# Patient Record
Sex: Male | Born: 1940 | Race: White | Hispanic: No | Marital: Married | Smoking: Never smoker
Health system: Southern US, Community
[De-identification: ages and names within clinical notes are randomized; demographics above are authoritative.]

## PROBLEM LIST (undated history)

## (undated) DIAGNOSIS — I714 Abdominal aortic aneurysm, without rupture, unspecified: Secondary | ICD-10-CM

## (undated) DIAGNOSIS — M109 Gout, unspecified: Secondary | ICD-10-CM

## (undated) DIAGNOSIS — Z9109 Other allergy status, other than to drugs and biological substances: Secondary | ICD-10-CM

## (undated) DIAGNOSIS — E785 Hyperlipidemia, unspecified: Secondary | ICD-10-CM

## (undated) HISTORY — DX: Abdominal aortic aneurysm, without rupture: I71.4

## (undated) HISTORY — DX: Abdominal aortic aneurysm, without rupture, unspecified: I71.40

## (undated) HISTORY — PX: NO PAST SURGERIES: SHX2092

## (undated) HISTORY — PX: TONSILLECTOMY: SUR1361

---

## 2012-07-06 ENCOUNTER — Inpatient Hospital Stay (HOSPITAL_COMMUNITY)
Admission: EM | Admit: 2012-07-06 | Discharge: 2012-07-10 | DRG: 690 | Disposition: A | Discharge status: Home / Self Care | Payer: Medicare Other | Attending: Internal Medicine | Admitting: Internal Medicine

## 2012-07-06 ENCOUNTER — Encounter (HOSPITAL_COMMUNITY): Payer: Self-pay | Admitting: Emergency Medicine

## 2012-07-06 ENCOUNTER — Emergency Department (HOSPITAL_COMMUNITY): Payer: Medicare Other

## 2012-07-06 DIAGNOSIS — M79671 Pain in right foot: Secondary | ICD-10-CM

## 2012-07-06 DIAGNOSIS — E860 Dehydration: Secondary | ICD-10-CM | POA: Diagnosis present

## 2012-07-06 DIAGNOSIS — N390 Urinary tract infection, site not specified: Principal | ICD-10-CM | POA: Diagnosis present

## 2012-07-06 DIAGNOSIS — M79672 Pain in left foot: Secondary | ICD-10-CM | POA: Diagnosis present

## 2012-07-06 DIAGNOSIS — R509 Fever, unspecified: Secondary | ICD-10-CM

## 2012-07-06 DIAGNOSIS — E876 Hypokalemia: Secondary | ICD-10-CM | POA: Diagnosis present

## 2012-07-06 DIAGNOSIS — Z79899 Other long term (current) drug therapy: Secondary | ICD-10-CM

## 2012-07-06 DIAGNOSIS — E871 Hypo-osmolality and hyponatremia: Secondary | ICD-10-CM | POA: Diagnosis present

## 2012-07-06 DIAGNOSIS — E785 Hyperlipidemia, unspecified: Secondary | ICD-10-CM | POA: Diagnosis present

## 2012-07-06 DIAGNOSIS — M109 Gout, unspecified: Secondary | ICD-10-CM | POA: Diagnosis present

## 2012-07-06 DIAGNOSIS — D638 Anemia in other chronic diseases classified elsewhere: Secondary | ICD-10-CM | POA: Diagnosis present

## 2012-07-06 DIAGNOSIS — M79673 Pain in unspecified foot: Secondary | ICD-10-CM

## 2012-07-06 DIAGNOSIS — D649 Anemia, unspecified: Secondary | ICD-10-CM | POA: Diagnosis present

## 2012-07-06 DIAGNOSIS — M79609 Pain in unspecified limb: Secondary | ICD-10-CM | POA: Diagnosis present

## 2012-07-06 DIAGNOSIS — R269 Unspecified abnormalities of gait and mobility: Secondary | ICD-10-CM | POA: Diagnosis present

## 2012-07-06 DIAGNOSIS — J111 Influenza due to unidentified influenza virus with other respiratory manifestations: Secondary | ICD-10-CM | POA: Diagnosis present

## 2012-07-06 HISTORY — DX: Hyperlipidemia, unspecified: E78.5

## 2012-07-06 LAB — CBC WITH DIFFERENTIAL/PLATELET
Basophils Absolute: 0.1 10*3/uL (ref 0.0–0.1)
Eosinophils Relative: 0 % (ref 0–5)
HCT: 40 % (ref 39.0–52.0)
Lymphs Abs: 1 10*3/uL (ref 0.7–4.0)
MCH: 32.4 pg (ref 26.0–34.0)
MCV: 92.6 fL (ref 78.0–100.0)
Monocytes Absolute: 2.5 10*3/uL — ABNORMAL HIGH (ref 0.1–1.0)
Neutro Abs: 6.8 10*3/uL (ref 1.7–7.7)
Platelets: 297 10*3/uL (ref 150–400)
RDW: 12.7 % (ref 11.5–15.5)

## 2012-07-06 LAB — COMPREHENSIVE METABOLIC PANEL
AST: 37 U/L (ref 0–37)
BUN: 19 mg/dL (ref 6–23)
CO2: 25 mEq/L (ref 19–32)
Calcium: 9.3 mg/dL (ref 8.4–10.5)
Creatinine, Ser: 1.14 mg/dL (ref 0.50–1.35)
GFR calc non Af Amer: 63 mL/min — ABNORMAL LOW (ref >90)
Total Bilirubin: 0.9 mg/dL (ref 0.3–1.2)

## 2012-07-06 LAB — URINALYSIS, ROUTINE W REFLEX MICROSCOPIC
Nitrite: POSITIVE — AB
Protein, ur: 100 mg/dL — AB
Specific Gravity, Urine: 1.035 — ABNORMAL HIGH (ref 1.005–1.030)
Urobilinogen, UA: 4 mg/dL — ABNORMAL HIGH (ref 0.0–1.0)

## 2012-07-06 LAB — URINE MICROSCOPIC-ADD ON

## 2012-07-06 LAB — LACTIC ACID, PLASMA: Lactic Acid, Venous: 2.9 mmol/L — ABNORMAL HIGH (ref 0.5–2.2)

## 2012-07-06 LAB — SEDIMENTATION RATE: Sed Rate: 72 mm/hr — ABNORMAL HIGH (ref 0–16)

## 2012-07-06 MED ORDER — SODIUM CHLORIDE 0.9 % IV SOLN
1000.0000 mL | INTRAVENOUS | Status: DC
  Administered 2012-07-06: 1000 mL via INTRAVENOUS

## 2012-07-06 MED ORDER — SODIUM CHLORIDE 0.9 % IV SOLN
1000.0000 mL | Freq: Once | INTRAVENOUS | Status: AC
  Administered 2012-07-06: 1000 mL via INTRAVENOUS

## 2012-07-06 MED ORDER — ACETAMINOPHEN 325 MG PO TABS
650.0000 mg | ORAL_TABLET | ORAL | Status: DC | PRN
  Administered 2012-07-06: 650 mg via ORAL
  Filled 2012-07-06 (×2): qty 2

## 2012-07-06 NOTE — ED Notes (Signed)
Pt states that he has had flu like sx for the past week (cough, sneeze, body aches, fever, chills).  Pt states that his worst problem is the pain in both of his feet.  States pain is so debilitating that he cannot walk.  Pt is normally able to get around just fine by himself.  Pain 10/10.  Temp 100.7 in triage.

## 2012-07-06 NOTE — ED Notes (Signed)
Patient transported to X-ray 

## 2012-07-06 NOTE — ED Notes (Signed)
Report given to floor RN

## 2012-07-06 NOTE — ED Provider Notes (Signed)
History     CSN: [id number]  Arrival date & time 07/06/12  1611   First MD Initiated Contact with Patient 07/06/12 1816      Chief Complaint  Patient presents with  . Flu Like Sx   . Foot Pain    (Consider location/radiation/quality/duration/timing/severity/associated sxs/prior treatment) HPI Comments: Syeed Rathgeber is a 71 y.o. Male who has been ill for one week with a cough, fever, and chills. Today, he is unable to walk, secondary to worsening, bilateral foot pain. There's been no trauma. He reports the cough is nonproductive and is improving. He has had decreased appetite for several days. He is taking an over-the-counter cough and cold medicine with partial relief. There are no other modifying factors. His wife is sick with similar illness. He has had gout in the past, but does not take medicine for it routinely  Patient is a 71 y.o. male presenting with lower extremity pain. The history is provided by the patient.  Foot Pain    History reviewed. No pertinent past medical history.  History reviewed. No pertinent past surgical history.  No family history on file.  History  Substance Use Topics  . Smoking status: Never Smoker   . Smokeless tobacco: Not on file  . Alcohol Use: No      Review of Systems  All other systems reviewed and are negative.    Allergies  Review of patient's allergies indicates no known allergies.  Home Medications   Current Outpatient Rx  Name  Route  Sig  Dispense  Refill  . ACETAMINOPHEN 500 MG PO TABS   Oral   Take 1,000 mg by mouth every 6 (six) hours as needed. Pain         . ATORVASTATIN CALCIUM 40 MG PO TABS   Oral   Take 20 mg by mouth daily.         Marland Kitchen DIPHENHYDRAMINE HCL 25 MG PO TABS   Oral   Take 25 mg by mouth every 6 (six) hours as needed.         Marland Kitchen DIVALPROEX SODIUM ER 250 MG PO TB24   Oral   Take 500 mg by mouth daily.         . GUAIFENESIN 100 MG/5ML PO LIQD   Oral   Take 200 mg by mouth 3 (three)  times daily as needed.         Marland Kitchen PSEUDOEPHEDRINE-ACETAMINOPHEN 30-500 MG PO TABS   Oral   Take 1 tablet by mouth every 4 (four) hours as needed. congestion           BP 140/78  Pulse 102  Temp 101.5 F (38.6 C) (Oral)  Resp 19  SpO2 98%  Physical Exam  Nursing note and vitals reviewed. Constitutional: He is oriented to person, place, and time. He appears well-developed and well-nourished.  HENT:  Head: Normocephalic and atraumatic.  Right Ear: External ear normal.  Left Ear: External ear normal.  Eyes: Conjunctivae normal and EOM are normal. Pupils are equal, round, and reactive to light.  Neck: Normal range of motion and phonation normal. Neck supple.  Cardiovascular: Normal rate, regular rhythm, normal heart sounds and intact distal pulses.        Capillary refill is intact distally in the feet  Pulmonary/Chest: Effort normal and breath sounds normal. He exhibits no bony tenderness.  Abdominal: Soft. Normal appearance. There is no tenderness.  Musculoskeletal: Normal range of motion.       Bilateral diffuse foot pain, and  ankle pain, right greater than left. There is indistinct erythematous areas on both feet. There is no foot swelling. There are no other large joint, tenderness or swelling areas.  Neurological: He is alert and oriented to person, place, and time. He has normal strength. No cranial nerve deficit or sensory deficit. He exhibits normal muscle tone. Coordination normal.  Skin: Skin is warm, dry and intact.  Psychiatric: He has a normal mood and affect. His behavior is normal. Judgment and thought content normal.    ED Course  Procedures (including critical care time)       Labs Reviewed  CBC WITH DIFFERENTIAL - Abnormal; Notable for the following:    Lymphocytes Relative 10 (*)     Monocytes Relative 24 (*)     Monocytes Absolute 2.5 (*)     All other components within normal limits  COMPREHENSIVE METABOLIC PANEL - Abnormal; Notable for the  following:    Sodium 132 (*)     Chloride 93 (*)     Glucose, Bld 136 (*)     Albumin 3.4 (*)     Alkaline Phosphatase 138 (*)     GFR calc non Af Amer 63 (*)     GFR calc Af Amer 73 (*)     All other components within normal limits  URINALYSIS, ROUTINE W REFLEX MICROSCOPIC - Abnormal; Notable for the following:    Color, Urine ORANGE (*)  BIOCHEMICALS MAY BE AFFECTED BY COLOR   APPearance CLOUDY (*)     Specific Gravity, Urine 1.035 (*)     Hgb urine dipstick MODERATE (*)     Bilirubin Urine MODERATE (*)     Ketones, ur TRACE (*)     Protein, ur 100 (*)     Urobilinogen, UA 4.0 (*)     Nitrite POSITIVE (*)     Leukocytes, UA SMALL (*)     All other components within normal limits  LACTIC ACID, PLASMA - Abnormal; Notable for the following:    Lactic Acid, Venous 2.9 (*)     All other components within normal limits  SEDIMENTATION RATE - Abnormal; Notable for the following:    Sed Rate 72 (*)     All other components within normal limits  URINE MICROSCOPIC-ADD ON - Abnormal; Notable for the following:    Bacteria, UA FEW (*)     All other components within normal limits  URIC ACID  CULTURE, BLOOD (ROUTINE X 2)  CULTURE, BLOOD (ROUTINE X 2)  URINE CULTURE  INFLUENZA PANEL BY PCR   Dg Chest 2 View  07/06/2012  *RADIOLOGY REPORT*  Clinical Data: Congestion and shortness of breath.  CHEST - 2 VIEW  Comparison: None  Findings: The cardiac silhouette, mediastinal and hilar contours are normal.  The lungs are clear.  No pleural effusion.  The bony thorax is intact.  IMPRESSION: No acute cardiopulmonary findings.   Original Report Authenticated By: Rudie Meyer, M.D.      1. Fever   2. Foot pain       MDM  Febrile illness with foot pain. Mild, lactate elevation, but normal blood pressure. He was SIRS Positive. Doubt severe sepsis. Most likely unifying diagnosis is post infectious disease, arthritis. Influenza is a possibility. No evidence for pneumonia. Urinalysis is abnormal,  however, not clearly indicative of UTI. Cultures pending. Patient is unable ambulate, therefore, will be admitted for stabilization and further evaluation.    Plan: Admit- [facility]        Flint Melter,  MD 07/08/12 1610

## 2012-07-06 NOTE — ED Notes (Signed)
Attempted to call report to floor RN. RN was busy. Will call back.

## 2012-07-06 NOTE — ED Notes (Signed)
Bedside report received from previous RN 

## 2012-07-06 NOTE — ED Notes (Signed)
Lab called. Will send swab for Flu PCR.

## 2012-07-07 ENCOUNTER — Encounter (HOSPITAL_COMMUNITY): Payer: Self-pay | Admitting: *Deleted

## 2012-07-07 DIAGNOSIS — E860 Dehydration: Secondary | ICD-10-CM

## 2012-07-07 DIAGNOSIS — E785 Hyperlipidemia, unspecified: Secondary | ICD-10-CM

## 2012-07-07 DIAGNOSIS — R6889 Other general symptoms and signs: Secondary | ICD-10-CM

## 2012-07-07 DIAGNOSIS — M79609 Pain in unspecified limb: Secondary | ICD-10-CM

## 2012-07-07 DIAGNOSIS — J111 Influenza due to unidentified influenza virus with other respiratory manifestations: Secondary | ICD-10-CM

## 2012-07-07 LAB — BASIC METABOLIC PANEL
BUN: 15 mg/dL (ref 6–23)
Calcium: 7.8 mg/dL — ABNORMAL LOW (ref 8.4–10.5)
GFR calc non Af Amer: 82 mL/min — ABNORMAL LOW (ref >90)
Glucose, Bld: 108 mg/dL — ABNORMAL HIGH (ref 70–99)
Sodium: 136 mEq/L (ref 135–145)

## 2012-07-07 LAB — CBC
MCH: 32.3 pg (ref 26.0–34.0)
MCHC: 35.1 g/dL (ref 30.0–36.0)
Platelets: 248 10*3/uL (ref 150–400)
RBC: 3.47 MIL/uL — ABNORMAL LOW (ref 4.22–5.81)

## 2012-07-07 LAB — INFLUENZA PANEL BY PCR (TYPE A & B): H1N1 flu by pcr: NOT DETECTED

## 2012-07-07 MED ORDER — ENOXAPARIN SODIUM 40 MG/0.4ML ~~LOC~~ SOLN
40.0000 mg | SUBCUTANEOUS | Status: DC
  Administered 2012-07-07 – 2012-07-10 (×4): 40 mg via SUBCUTANEOUS
  Filled 2012-07-07 (×5): qty 0.4

## 2012-07-07 MED ORDER — MORPHINE SULFATE 2 MG/ML IJ SOLN
1.0000 mg | INTRAMUSCULAR | Status: DC | PRN
  Administered 2012-07-07 – 2012-07-09 (×6): 1 mg via INTRAVENOUS
  Filled 2012-07-07 (×6): qty 1

## 2012-07-07 MED ORDER — CEFTRIAXONE SODIUM 1 G IJ SOLR
1.0000 g | INTRAMUSCULAR | Status: DC
  Administered 2012-07-07 – 2012-07-09 (×3): 1 g via INTRAVENOUS
  Filled 2012-07-07 (×3): qty 10

## 2012-07-07 MED ORDER — IBUPROFEN 400 MG PO TABS
400.0000 mg | ORAL_TABLET | Freq: Three times a day (TID) | ORAL | Status: AC
  Administered 2012-07-07 – 2012-07-08 (×6): 400 mg via ORAL
  Filled 2012-07-07 (×7): qty 1

## 2012-07-07 MED ORDER — ACETAMINOPHEN 650 MG RE SUPP: 650.0000 mg | Freq: Four times a day (QID) | RECTAL | Status: DC | PRN

## 2012-07-07 MED ORDER — GUAIFENESIN-DM 100-10 MG/5ML PO SYRP
5.0000 mL | ORAL_SOLUTION | ORAL | Status: DC | PRN
  Administered 2012-07-07 – 2012-07-08 (×3): 5 mL via ORAL
  Filled 2012-07-07 (×3): qty 10

## 2012-07-07 MED ORDER — ONDANSETRON HCL 4 MG/2ML IJ SOLN: 4.0000 mg | Freq: Four times a day (QID) | INTRAMUSCULAR | Status: DC | PRN

## 2012-07-07 MED ORDER — DIVALPROEX SODIUM ER 500 MG PO TB24
500.0000 mg | ORAL_TABLET | Freq: Every day | ORAL | Status: DC
Start: 2012-07-07 — End: 2012-07-10
  Administered 2012-07-07 – 2012-07-10 (×4): 500 mg via ORAL
  Filled 2012-07-07 (×5): qty 1

## 2012-07-07 MED ORDER — ONDANSETRON HCL 4 MG PO TABS: 4.0000 mg | ORAL_TABLET | Freq: Four times a day (QID) | ORAL | Status: DC | PRN

## 2012-07-07 MED ORDER — SODIUM CHLORIDE 0.9 % IV SOLN
1000.0000 mL | INTRAVENOUS | Status: DC
  Administered 2012-07-07 – 2012-07-08 (×2): 1000 mL via INTRAVENOUS

## 2012-07-07 MED ORDER — ATORVASTATIN CALCIUM 20 MG PO TABS
20.0000 mg | ORAL_TABLET | Freq: Every day | ORAL | Status: DC
  Administered 2012-07-07 – 2012-07-09 (×3): 20 mg via ORAL
  Filled 2012-07-07 (×4): qty 1

## 2012-07-07 MED ORDER — ALBUTEROL SULFATE (5 MG/ML) 0.5% IN NEBU: 2.5000 mg | INHALATION_SOLUTION | RESPIRATORY_TRACT | Status: DC | PRN

## 2012-07-07 MED ORDER — POTASSIUM CHLORIDE 10 MEQ/100ML IV SOLN
10.0000 meq | INTRAVENOUS | Status: AC
  Administered 2012-07-07 (×3): 10 meq via INTRAVENOUS
  Filled 2012-07-07 (×3): qty 100

## 2012-07-07 MED ORDER — OXYCODONE HCL 5 MG PO TABS
5.0000 mg | ORAL_TABLET | ORAL | Status: DC | PRN
  Filled 2012-07-07: qty 1

## 2012-07-07 NOTE — Progress Notes (Addendum)
[facility] PROGRESS NOTE  Gino Garrabrant XBJ:[id number] DOB: 10-27-1940 DOA: 07/06/2012 PCP: No primary provider on file.  Brief narrative: 71 -year-old male with history of HL and gout presents to the emergency department with one-week history of flulike symptoms. Evaluation in ED revealed urinary tract infection. Influenza panel is negative. We will D/C droplet precaution.  Assessment/Plan:  Principal Problem:  *Urinary tract infection - Will start Rocephin IV daily - Follow up results of urine culture  Active Problems:  Dehydration - Secondary to urinary tract infection - Continue IV fluids - Encourage oral intake  Hypokalemia -  Repleted today - Followup BMP in the morning  Anemia - Hemoglobin dropped from 14 to 11.2; this may be due to lab error - Follow hemoglobin in the morning  Dyslipidemia - Continue atorvastatin 20 mg daily  Code Status: full code Family Communication: no family at bedside Disposition Plan: home when stable; needs PT evaluation  Manson Passey, MD  [facility] Pager [phone]  If 7PM-7AM, please contact night-coverage [email or web address] Password [email or web address] 07/07/2012, 3:07 PM   LOS: 1 day   Consultants:  None   Procedures:  None   Antibiotics:  Rocephin 07/07/2012 -->  HPI/Subjective: Feels weak.  Objective: Filed Vitals:   07/07/12 0018 07/07/12 0020 07/07/12 0600 07/07/12 1300  BP:  132/72 125/89 104/52  Pulse:  91 84 85  Temp:  99.1 F (37.3 C) 98.9 F (37.2 C) 97.9 F (36.6 C)  TempSrc:  Oral Oral   Resp:  20 20 16   Height: 5\' 6"  (1.676 m)     Weight: 69.854 kg (154 lb)     SpO2:  96% 98% 95%    Intake/Output Summary (Last 24 hours) at 07/07/12 1507 Last data filed at 07/07/12 1300  Gross per 24 hour  Intake   1080 ml  Output   1053 ml  Net     27 ml    Exam:   General:  Pt is alert, follows commands appropriately, not in acute distress  Cardiovascular: Regular rate and rhythm, S1/S2, no murmurs, no rubs, no  gallops  Respiratory: Clear to auscultation bilaterally, no wheezing, no crackles, no rhonchi  Abdomen: Soft, mildly tender across mid abdomen, non distended, bowel sounds present, no guarding  Extremities: LE pitting pedal (+1-2) edema, pulses DP and PT palpable bilaterally  Neuro: Grossly nonfocal  Data Reviewed: Basic Metabolic Panel:  Lab 07/07/12 4696 07/06/12 1846  NA 136 132*  K 3.2* 3.6  CL 104 93*  CO2 22 25  GLUCOSE 108* 136*  BUN 15 19  CREATININE 0.97 1.14  CALCIUM 7.8* 9.3   Liver Function Tests:  Lab 07/06/12 1846  AST 37  ALT 41  ALKPHOS 138*  BILITOT 0.9  PROT 8.2  ALBUMIN 3.4*   CBC:  Lab 07/07/12 0357 07/06/12 1846  WBC 9.7 10.4  HGB 11.2* 14.0  HCT 31.9* 40.0  MCV 91.9 92.6  PLT 248 297    Studies: Dg Chest 2 View 07/06/2012  *IMPRESSION: No acute cardiopulmonary findings.    Scheduled Meds:  . atorvastatin  20 mg Oral Daily  . cefTRIAXone   1 g Intravenous Q24H  . divalproex  500 mg Oral Daily  . enoxaparin (LOVENOX)   40 mg Subcutaneous Q24H  . ibuprofen  400 mg Oral TID

## 2012-07-07 NOTE — Evaluation (Signed)
Physical Therapy Evaluation Patient Details Name: Jose Wright MRN: [id number] DOB: 1941-06-10 Today's Date: 07/07/2012 Time: 1308-6578 PT Time Calculation (min): 25 min  PT Assessment / Plan / Recommendation Clinical Impression  71 yo male with flulike symptoms for >7 days admitted with pain in both feet R>L with inalbility to stand or walk.  Today, he was found to have edema in feet and ankles with pain and decreased ROM.  He was unable to stand depsite good effort. Hopefully, pt will progress with acute care and be able to d/c to home with HHPT and assist of wife.  If not, pt may need short term SNF    PT Assessment  Patient needs continued PT services    Follow Up Recommendations  Home health PT;Post acute inpatient;Other (comment) (depends on pt progress)    Does the patient have the potential to tolerate intense rehabilitation      Barriers to Discharge        Equipment Recommendations  Rolling walker with 5" wheels;3 in 1 bedside comode    Recommendations for Other Services OT consult   Frequency Min 3X/week    Precautions / Restrictions Precautions Precautions: Fall Restrictions Weight Bearing Restrictions: No   Pertinent Vitals/Pain Pt c/o acute pain in feet especially with touch and ROM      Mobility  Bed Mobility Bed Mobility: Rolling Right;Rolling Left Rolling Right: 6: Modified independent (Device/Increase time);With rail Rolling Left: 6: Modified independent (Device/Increase time);With rail Details for Bed Mobility Assistance: extra tme for rolling to left Transfers Transfers: Sit to Stand;Stand to Sit Sit to Stand: Other (comment) (unable to lift hips off bed despite multiple attempts) Details for Transfer Assistance: Pt tried multiple times with arms on walker/bedside rail, but he was uanble to bear wieight on feet enough to stand.  Pt reports he had too much pain and LE weakness also Ambulation/Gait Ambulation/Gait Assistance: Not tested (comment)      Shoulder Instructions     Exercises General Exercises - Lower Extremity Ankle Circles/Pumps: AROM;Right;Left;10 reps;Supine;Seated (also encouraged toe ROM for flex/ext/add/abd) Quad Sets: AROM;Both;10 reps;Supine (unable to fully extend right knee) Gluteal Sets: AROM;Both;10 reps;Supine Short Arc Quad: AROM;Both;10 reps;Supine Hip ABduction/ADduction: AROM;Both;10 reps;Sidelying Straight Leg Raises: AROM;Both;10 reps;Supine Hip Flexion/Marching: AROM;Both;10 reps;Supine Toe Raises: AROM;Both;5 reps;Seated Heel Raises: AROM;Both;Seated Other Exercises Other Exercises: bridges x 10 in supine Other Exercises: deep breathing, abd sets   PT Diagnosis: Difficulty walking;Generalized weakness;Acute pain  PT Problem List: Decreased strength;Pain;Decreased mobility;Decreased activity tolerance;Decreased knowledge of use of DME PT Treatment Interventions: DME instruction;Gait training;Functional mobility training;Therapeutic activities;Therapeutic exercise;Cognitive remediation;Patient/family education   PT Goals Acute Rehab PT Goals PT Goal Formulation: With patient Time For Goal Achievement: 07/21/12 Potential to Achieve Goals: Good Pt will go Supine/Side to Sit: Independently PT Goal: Supine/Side to Sit - Progress: Goal set today Pt will go Sit to Supine/Side: Independently PT Goal: Sit to Supine/Side - Progress: Goal set today Pt will go Sit to Stand: with modified independence PT Goal: Sit to Stand - Progress: Goal set today Pt will go Stand to Sit: with modified independence PT Goal: Stand to Sit - Progress: Goal set today Pt will Ambulate: >150 feet;with supervision;with least restrictive assistive device PT Goal: Ambulate - Progress: Goal set today Pt will Go Up / Down Stairs: 3-5 stairs;with min assist;with least restrictive assistive device PT Goal: Up/Down Stairs - Progress: Goal set today  Visit Information  Last PT Received On: 07/07/12 Assistance Needed: +2     Subjective Data  Subjective: "I've never  had anything like this before" Patient Stated Goal: To get better   Prior Functioning  Home Living Lives With: Spouse Available Help at Discharge: Family Type of Home: House Home Access: Stairs to enter [employer] of Steps: a couple Entrance Stairs-Rails: Right;Left Home Layout: Able to live on main level with bedroom/bathroom Prior Function Level of Independence: Independent Able to Take Stairs?: Yes Driving: Yes Communication Communication: No difficulties    Cognition  Overall Cognitive Status: Appears within functional limits for tasks assessed/performed Arousal/Alertness: Awake/alert Orientation Level: Appears intact for tasks assessed Behavior During Session: [facility] for tasks performed    Extremity/Trunk Assessment Right Lower Extremity Assessment RLE ROM/Strength/Tone: Deficits RLE ROM/Strength/Tone Deficits: pt with pitting edema in foot and ankle, warm to touch.  Pt holds knee and ankle in semi flexion with decreased ability to straighten knee all the way out or dorsiflex ankle to neutral  RLE Sensation: Deficits RLE Sensation Deficits: cannot assess as pt has too much pain to touch Left Lower Extremity Assessment LLE ROM/Strength/Tone: Deficits LLE ROM/Strength/Tone Deficits: less swelling and pain in left leg, but pt still has some.  He is better able to move hip, knee and ankle, but still lacks some dorsiflexion to neutral LLE Sensation: Deficits LLE Sensation Deficits: pain to touch Trunk Assessment Trunk Assessment: Normal   Balance Balance Balance Assessed: Yes Static Sitting Balance Static Sitting - Balance Support: No upper extremity supported Static Sitting - Level of Assistance: 7: Independent Static Sitting - Comment/# of Minutes: ~5  End of Session PT - End of Session Activity Tolerance: Patient limited by pain Patient left: in bed Nurse Communication: Mobility status  GP     Rosey Bath K. Manson Passey,  PT 07/07/2012, 3:18 PM

## 2012-07-07 NOTE — Progress Notes (Signed)
ANTIBIOTIC CONSULT NOTE - INITIAL  Pharmacy Consult for Rocephin Indication: UTI pending cx results  No Known Allergies  Labs:  [facility] 07/07/12 0357 07/06/12 1846  WBC 9.7 10.4  HGB 11.2* 14.0  PLT 248 297  [facility] -- --  CREATININE 0.97 1.14   Estimated Creatinine Clearance: 63.9 ml/min (by C-G formula based on Cr of 0.97). No results found for this basename: VANCOTROUGH:2,VANCOPEAK:2,VANCORANDOM:2,GENTTROUGH:2,GENTPEAK:2,GENTRANDOM:2,TOBRATROUGH:2,TOBRAPEAK:2,TOBRARND:2,AMIKACINPEAK:2,AMIKACINTROU:2,AMIKACIN:2, in the last 72 hours   Microbiology: No results found for this or any previous visit (from the past 720 hour(s)).  Medical History: Past Medical History  Diagnosis Date  . Hyperlipemia     Medications:  Scheduled:    . [COMPLETED] sodium chloride  1,000 mL Intravenous Once   Followed by  . [COMPLETED] sodium chloride  1,000 mL Intravenous Once  . atorvastatin  20 mg Oral Daily  . divalproex  500 mg Oral Daily  . enoxaparin (LOVENOX) injection  40 mg Subcutaneous Q24H  . ibuprofen  400 mg Oral TID  . potassium chloride  10 mEq Intravenous Q1 Hr x 3   Assessment: 71 yo male admitted with flu-like symptoms found to have a positive UA and now starting Rocephin pending cx results  Goal of Therapy:  eradication of infection  Plan:  1) Rocephin 1g IV q24 2) Follow up Cx results 3) Will sign off. Thank you for the consult   Hessie Knows, PharmD, BCPS Pager [phone] 07/07/2012 10:46 AM

## 2012-07-07 NOTE — H&P (Signed)
[facility] History and Physical  [name] AVW:[id number] DOB: 1940/12/08 DOA: 07/06/2012  Referring physician: Dr. Mancel Bale PCP: No primary provider on file. sees PCP in the [place]. Specialists: None  Chief Complaint: Flulike symptoms and feet pain.  HPI: Jose Wright is a 71 y.o. male with history of HL and gout presents to the emergency department with one-week history of "flulike symptoms". He and his wife developed symptoms at the same time. He gives history of cough which is mostly been dry and occasional green sputum, fevers as high as 101F, sneezing, headache, generalized body aches and pains, poor appetite, generalized weakness. He has been using over-the-counter cough medications along with Tylenol. He has not taken flu shot this year yet. For the last 4 days he has noticed pain in both feet without any associated swelling.? Redness of the left foot. Gives remote history of gout. Has not been able to stand up or walk secondary to feet pain. Due to persistent symptoms, patient presented to the emergency department. In the ED, patient had MAXIMUM TEMPERATURE of 101.62F, chest x-ray negative, UA not convincing for UTI and patient does not have any dysuria or urinary frequency and ESR 72. Patient denies sore throat, neck pain, nausea, vomiting, abdominal pain, diarrhea, open wounds. The hospitalist service is requested to admit for further evaluation and management. Blood cultures, urine culture and influenza panel have been sent and pending.   Review of Systems: All systems reviewed and apart from history of presenting illness, are negative.  Past Medical History  Diagnosis Date  . Hyperlipemia    Past Surgical History  Procedure Date  . No past surgeries    Social History:  reports that he has never smoked. He does not have any smokeless tobacco history on file. He reports that he does not drink alcohol or use illicit drugs. Patient is married. He is a  retired Engineer, production. Lives with his wife and is independent of activities of daily living.  No Known Allergies  Family History  Problem Relation Age of Onset  . Asthma Father   . Asthma Son     Prior to Admission medications   Medication Sig Start Date End Date Taking? Authorizing Provider  acetaminophen (TYLENOL) 500 MG tablet Take 1,000 mg by mouth every 6 (six) hours as needed. Pain   Yes Historical Provider, MD  atorvastatin (LIPITOR) 40 MG tablet Take 20 mg by mouth daily.   Yes Historical Provider, MD  diphenhydrAMINE (BENADRYL) 25 MG tablet Take 25 mg by mouth every 6 (six) hours as needed.   Yes Historical Provider, MD  divalproex (DEPAKOTE ER) 250 MG 24 hr tablet Take 500 mg by mouth daily.   Yes Historical Provider, MD  guaiFENesin (ROBITUSSIN) 100 MG/5ML liquid Take 200 mg by mouth 3 (three) times daily as needed.   Yes Historical Provider, MD  pseudoephedrine-acetaminophen (TYLENOL SINUS) 30-500 MG TABS Take 1 tablet by mouth every 4 (four) hours as needed. congestion   Yes Historical Provider, MD   Physical Exam: Filed Vitals:   07/06/12 2100 07/06/12 2116 07/06/12 2230 07/06/12 2258  BP: 140/78 140/78 144/81   Pulse: 95 102    Temp:  100.2 F (37.9 C)  101.5 F (38.6 C)  TempSrc:  Oral  Oral  Resp: 24 19 25    SpO2: 97% 98% 95%     General exam: Moderately built and nourished male patient, ill looking but not toxic, lying comfortably on the gurney and is in no obvious distress  Head, eyes and ENT: Nontraumatic and normocephalic. Pupils equally reacting to light and accommodation. Oral mucosa is dry. No acute findings.  Neck: Supple. No JVD, carotid bruit or thyromegaly.  Lymphatics: No lymphadenopathy.  Respiratory system: Clear to auscultation. No increased work of breathing.  Cardiovascular system: S1 and S2 heard, regular rate and rhythm. No JVD, murmurs or gallops. No pedal edema.  Gastrointestinal system: Abdomen is nondistended, soft and nontender. No  organomegaly or masses appreciated. Normal bowel sounds heard.  Central nervous system: Alert and oriented. No focal neurological deficits.  Extremities: Symmetric 5 x 5 power. Peripheral pulses symmetrically felt. Patient is tender to touch on the dorsum and plantar aspects of both feet right greater than the left but no other acute findings such as significant redness, joint swelling. No increased warmth in any particular area. Skin: Without any acute findings.  Musculoskeletal system: Exam is negative.  Psychiatric: Pleasant and cooperative.  Labs on Admission:  Basic Metabolic Panel:  Lab 07/06/12 3086  NA 132*  K 3.6  CL 93*  CO2 25  GLUCOSE 136*  BUN 19  CREATININE 1.14  CALCIUM 9.3  MG --  PHOS --   Liver Function Tests:  Lab 07/06/12 1846  AST 37  ALT 41  ALKPHOS 138*  BILITOT 0.9  PROT 8.2  ALBUMIN 3.4*   No results found for this basename: LIPASE:5,AMYLASE:5 in the last 168 hours No results found for this basename: AMMONIA:5 in the last 168 hours CBC:  Lab 07/06/12 1846  WBC 10.4  NEUTROABS 6.8  HGB 14.0  HCT 40.0  MCV 92.6  PLT 297   Cardiac Enzymes: No results found for this basename: CKTOTAL:5,CKMB:5,CKMBINDEX:5,TROPONINI:5 in the last 168 hours  BNP (last 3 results) No results found for this basename: PROBNP:3 in the last 8760 hours CBG: No results found for this basename: GLUCAP:5 in the last 168 hours  Radiological Exams on Admission: Dg Chest 2 View  07/06/2012  *RADIOLOGY REPORT*  Clinical Data: Congestion and shortness of breath.  CHEST - 2 VIEW  Comparison: None  Findings: The cardiac silhouette, mediastinal and hilar contours are normal.  The lungs are clear.  No pleural effusion.  The bony thorax is intact.  IMPRESSION: No acute cardiopulmonary findings.   Original Report Authenticated By: Rudie Meyer, M.D.     Assessment/Plan Principal Problem:  *Influenza-like illness Active Problems:  Dehydration  Pain in both feet   Hyperlipidemia   1. Influenza-like illness: Admit to medical floor for observation and evaluation. Followup on blood and urine cultures and influenza panel. Place on droplet isolation until influenza panel is tested and negative. Symptomatic treatment. 2. Hyponatremic Dehydration: Patient has received 2 L of normal saline in the ED. Continue with normal saline hydration. 3. Pain in both feet: Unclear etiology.? Secondary to problem #1. Does not seem like gout flare. No acute findings on physical exam. Treat with NSAIDs for 2 days and monitor. If symptoms are not better in the next 12-24 hours, consider further evaluation. Serum uric acid level is normal. 4. Hyperlipidemia: Continue home medications.    Code Status: Full Family Communication: Discussed directly with patient Disposition Plan: Home in medically stable.  Time spent: 50 minutes  [facility] Pager [phone]  If 7PM-7AM, please contact night-coverage [email or web address] Password [email or web address] 07/07/2012, 12:10 AM

## 2012-07-08 ENCOUNTER — Inpatient Hospital Stay (HOSPITAL_COMMUNITY): Payer: Medicare Other

## 2012-07-08 DIAGNOSIS — M79609 Pain in unspecified limb: Secondary | ICD-10-CM

## 2012-07-08 DIAGNOSIS — N390 Urinary tract infection, site not specified: Principal | ICD-10-CM

## 2012-07-08 DIAGNOSIS — R509 Fever, unspecified: Secondary | ICD-10-CM

## 2012-07-08 LAB — URINE CULTURE

## 2012-07-08 LAB — CBC
HCT: 32.6 % — ABNORMAL LOW (ref 39.0–52.0)
Hemoglobin: 10.9 g/dL — ABNORMAL LOW (ref 13.0–17.0)
MCV: 93.9 fL (ref 78.0–100.0)
RBC: 3.47 MIL/uL — ABNORMAL LOW (ref 4.22–5.81)
RDW: 13.4 % (ref 11.5–15.5)
WBC: 10.8 10*3/uL — ABNORMAL HIGH (ref 4.0–10.5)

## 2012-07-08 LAB — BASIC METABOLIC PANEL
BUN: 17 mg/dL (ref 6–23)
CO2: 22 mEq/L (ref 19–32)
Chloride: 105 mEq/L (ref 96–112)
Creatinine, Ser: 0.95 mg/dL (ref 0.50–1.35)
GFR calc Af Amer: >90 mL/min (ref >90)
Glucose, Bld: 87 mg/dL (ref 70–99)
Potassium: 3.3 mEq/L — ABNORMAL LOW (ref 3.5–5.1)

## 2012-07-08 MED ORDER — POTASSIUM CHLORIDE CRYS ER 20 MEQ PO TBCR
40.0000 meq | EXTENDED_RELEASE_TABLET | Freq: Once | ORAL | Status: AC
  Administered 2012-07-08: 40 meq via ORAL
  Filled 2012-07-08: qty 2

## 2012-07-08 MED ORDER — PREDNISONE 20 MG PO TABS
20.0000 mg | ORAL_TABLET | Freq: Every day | ORAL | Status: DC
  Administered 2012-07-08 – 2012-07-10 (×3): 20 mg via ORAL
  Filled 2012-07-08 (×5): qty 1

## 2012-07-08 MED ORDER — VANCOMYCIN HCL 1000 MG IV SOLR
750.0000 mg | Freq: Two times a day (BID) | INTRAVENOUS | Status: DC
  Administered 2012-07-08 – 2012-07-10 (×5): 750 mg via INTRAVENOUS
  Filled 2012-07-08 (×6): qty 750

## 2012-07-08 MED ORDER — COLCHICINE 0.6 MG PO TABS
0.6000 mg | ORAL_TABLET | Freq: Every day | ORAL | Status: DC
  Administered 2012-07-08 – 2012-07-09 (×2): 0.6 mg via ORAL
  Filled 2012-07-08 (×3): qty 1

## 2012-07-08 MED ORDER — POTASSIUM CHLORIDE 10 MEQ/100ML IV SOLN
10.0000 meq | INTRAVENOUS | Status: AC
  Administered 2012-07-08 (×3): 10 meq via INTRAVENOUS
  Filled 2012-07-08 (×3): qty 100

## 2012-07-08 NOTE — Progress Notes (Signed)
ANTIBIOTIC CONSULT NOTE - INITIAL  Pharmacy Consult for Vancomycin Indication: Cellulitis of LLE  No Known Allergies  Patient Measurements: Height: 5\' 6"  (167.6 cm) Weight: 154 lb (69.854 kg) IBW/kg (Calculated) : 63.8   Vital Signs: Temp: 99.2 F (37.3 C) (11/05 0500) Temp src: Oral (11/05 0500) BP: 127/56 mmHg (11/05 0500) Pulse Rate: 87  (11/05 0500) Intake/Output from previous day: 11/04 0701 - 11/05 0700 In: 3880 [P.O.:1680; I.V.:2200] Out: 653 [Urine:650; Stool:3] Intake/Output from this shift:    Labs:  Basename 07/08/12 0539 07/07/12 0357 07/06/12 1846  WBC 10.8* 9.7 10.4  HGB 10.9* 11.2* 14.0  PLT 241 248 297  LABCREA -- -- --  CREATININE 0.95 0.97 1.14   Estimated Creatinine Clearance: 65.3 ml/min (by C-G formula based on Cr of 0.95). No results found for this basename: VANCOTROUGH:2,VANCOPEAK:2,VANCORANDOM:2,GENTTROUGH:2,GENTPEAK:2,GENTRANDOM:2,TOBRATROUGH:2,TOBRAPEAK:2,TOBRARND:2,AMIKACINPEAK:2,AMIKACINTROU:2,AMIKACIN:2, in the last 72 hours   Microbiology: Recent Results (from the past 720 hour(s))  CULTURE, BLOOD (ROUTINE X 2)     Status: Normal (Preliminary result)   Collection Time   07/06/12  6:46 PM      Component Value Range Status Comment   Specimen Description BLOOD RIGHT ANTECUBITAL   Final    Special Requests BOTTLES DRAWN AEROBIC AND ANAEROBIC 5CC   Final    Culture  Setup Time 07/07/2012 09:45   Final    Culture     Final    Value:        BLOOD CULTURE RECEIVED NO GROWTH TO DATE CULTURE WILL BE HELD FOR 5 DAYS BEFORE ISSUING A FINAL NEGATIVE REPORT   Report Status PENDING   Incomplete   CULTURE, BLOOD (ROUTINE X 2)     Status: Normal (Preliminary result)   Collection Time   07/06/12  7:07 PM      Component Value Range Status Comment   Specimen Description BLOOD LEFT ARM   Final    Special Requests BOTTLES DRAWN AEROBIC AND ANAEROBIC [facility] EACH   Final    Culture  Setup Time 07/07/2012 09:45   Final    Culture     Final    Value:         BLOOD CULTURE RECEIVED NO GROWTH TO DATE CULTURE WILL BE HELD FOR 5 DAYS BEFORE ISSUING A FINAL NEGATIVE REPORT   Report Status PENDING   Incomplete   URINE CULTURE     Status: Normal   Collection Time   07/06/12  7:13 PM      Component Value Range Status Comment   Specimen Description URINE, CLEAN CATCH   Final    Special Requests NONE   Final    Culture  Setup Time 07/07/2012 10:17   Final    Colony Count 20,OOO COLONIES/ML   Final    Culture     Final    Value: Multiple bacterial morphotypes present, none predominant. Suggest appropriate recollection if clinically indicated.   Report Status 07/08/2012 FINAL   Final     Medical History: Past Medical History  Diagnosis Date  . Hyperlipemia     Medications:  Anti-infectives     Start     Dose/Rate Route Frequency Ordered Stop   07/08/12 1100   vancomycin (VANCOCIN) 750 mg in sodium chloride 0.9 % 150 mL IVPB        750 mg 150 mL/hr over 60 Minutes Intravenous 2 times daily 07/08/12 1038     07/07/12 1200   cefTRIAXone (ROCEPHIN) 1 g in dextrose 5 % 50 mL IVPB  1 g 100 mL/hr over 30 Minutes Intravenous Every 24 hours 07/07/12 1047           Assessment: 70 yoM with 1 wk hx of flu-like symptoms and 4 day hx of foot pain, no swelling noted, RLE questionable redness. Hx of gout; doubt cause of foot pain. CXray clear, negative for influenza, UA not convincing for UTI per H&P.  Rocephin begun 11/4, Tmax 101.5 on admit, continues with low grade temp.  Vancomycin ordered 11/5 for possible cellulitis RLE  Clearance wnl, no allergies noted.  Goal of Therapy:  Vancomycin trough level 10-15 mcg/ml  Plan:  Vancomycin 750mg  q12 Monitor blood, urine cultures, renal function Trough at steady state if necessary.  Otho Bellows PharmD Pager [phone] 07/08/2012,10:32 AM

## 2012-07-08 NOTE — Progress Notes (Signed)
*  PRELIMINARY RESULTS* Vascular Ultrasound Lower extremity venous duplex has been completed.  Preliminary findings: Bilateral:  No evidence of DVT, superficial thrombosis, or Baker's Cyst.    Farrel Demark, RDMS, RVT 07/08/2012, 2:41 PM

## 2012-07-08 NOTE — Progress Notes (Signed)
[facility] PROGRESS NOTE  Jose Wright ZOX:[id number] DOB: Dec 28, 1940 DOA: 07/06/2012 PCP: No primary provider on file.  Brief narrative: 71 -year-old male with history of HL and gout presents to the emergency department with one-week history of flulike symptoms. Evaluation in ED revealed urinary tract infection for which he is receiving ceftriaxone.. Influenza panel is negative. Hospital course is complicated due to persistent bilateral foot pain and swelling although right more than left along with the inability to bear weight on either leg.  X rays of both feet were done and there was no joint effusion or fractures/disslocations identified. Venous doppler done bilaterally and findings are negative for DVT. The pain perhaps may be due to gout flare up so we have started colchicine and prednisone along with pain medications.   Assessment/Plan:   Principal Problem:  *Urinary tract infection   We have started rocephin IV daily  Urine culture result showed too many morphologies so inaccurate specimen for analysis (since patient already received antibiotics we will not order another urine culture as it will likely be negative)  Active Problems:  Bilateral foot pain  Seems most likely gout flare up although unusual since both feel are involved; less likely septic joint(s)  There is slightly more edema and redness over right foot then left so the differential includes possible cellulitis  I have started vancomycin for possible cellulitis  Colchicine and prednisone started  Pain regimen is with oxycodone 5 mg PO Q 4 hours PRN moderate pain and morphine 1 mg IV Q 4 hours PRN severe pain  If patient does not improve ortho consult may be needed  Need PT evaluation Dehydration   Secondary to urinary tract infection or gout flare  Patient has received IV fluids  At this time patient tolerates PO intake so we will d/c IV fluids Hypokalemia   Repleted today   Followup BMP in  the morning  Anemia   Hemoglobin dropped from 14 to 11.2 and this morning to 10.9  No signs of active bleed  We will continue to follow CBC Dyslipidemia   Continue atorvastatin 20 mg daily   Code Status: full code  Family Communication: no family at bedside  Disposition Plan: home when stable; needs PT evaluation   Manson Passey, MD  [facility]  Pager [phone]   If 7PM-7AM, please contact night-coverage [email or web address] Password [email or web address] 07/08/2012, 2:54 PM   LOS: 2 days   HPI/Subjective: Still complains of feeling weak and having pain in his feet along with the inability to bear weight.  Objective: Filed Vitals:   07/07/12 0600 07/07/12 1300 07/07/12 2042 07/08/12 0500  BP: 125/89 104/52 99/54 127/56  Pulse: 84 85 74 87  Temp: 98.9 F (37.2 C) 97.9 F (36.6 C) 99 F (37.2 C) 99.2 F (37.3 C)  TempSrc: Oral  Tympanic Oral  Resp: 20 16 18 20   Height:      Weight:      SpO2: 98% 95% 99% 100%    Intake/Output Summary (Last 24 hours) at 07/08/12 1454 Last data filed at 07/08/12 1150  Gross per 24 hour  Intake   2800 ml  Output    675 ml  Net   2125 ml    Exam:   General:  Pt is alert, follows commands appropriately, not in acute distress  Cardiovascular: Regular rate and rhythm, S1/S2, no murmurs, no rubs, no gallops  Respiratory: Clear to auscultation bilaterally, no wheezing, no crackles, no rhonchi  Abdomen: Soft, non tender, non distended, bowel sounds present,  no guarding  Extremities: pedal edema right > left as well as new erythema and tenderness to palpation, pulses DP and PT palpable bilaterally  Neuro: Grossly nonfocal  Data Reviewed: Basic Metabolic Panel:  Lab 07/08/12 1914 07/07/12 0357 07/06/12 1846  NA 137 136 132*  K 3.3* 3.2* 3.6  CL 105 104 93*  CO2 22 22 25   GLUCOSE 87 108* 136*  BUN 17 15 19   CREATININE 0.95 0.97 1.14  CALCIUM 7.9* 7.8* 9.3   Liver Function Tests:  Lab 07/06/12 1846  AST 37  ALT 41  ALKPHOS 138*  BILITOT 0.9    PROT 8.2  ALBUMIN 3.4*   CBC:  Lab 07/08/12 0539 07/07/12 0357 07/06/12 1846  WBC 10.8* 9.7 10.4  HGB 10.9* 11.2* 14.0  HCT 32.6* 31.9* 40.0  MCV 93.9 91.9 92.6  PLT 241 248 297    Recent Results (from the past 240 hour(s))  CULTURE, BLOOD (ROUTINE X 2)     Status: Normal (Preliminary result)   Collection Time   07/06/12  6:46 PM      Component Value Range Status Comment   Specimen Description BLOOD RIGHT ANTECUBITAL   Final    Special Requests BOTTLES DRAWN AEROBIC AND ANAEROBIC 5CC   Final    Culture  Setup Time 07/07/2012 09:45   Final    Culture     Final    Value:        BLOOD CULTURE RECEIVED NO GROWTH TO DATE CULTURE WILL BE HELD FOR 5 DAYS BEFORE ISSUING A FINAL NEGATIVE REPORT   Report Status PENDING   Incomplete   CULTURE, BLOOD (ROUTINE X 2)     Status: Normal (Preliminary result)   Collection Time   07/06/12  7:07 PM      Component Value Range Status Comment   Specimen Description BLOOD LEFT ARM   Final    Special Requests BOTTLES DRAWN AEROBIC AND ANAEROBIC [facility] EACH   Final    Culture  Setup Time 07/07/2012 09:45   Final    Culture     Final    Value:        BLOOD CULTURE RECEIVED NO GROWTH TO DATE CULTURE WILL BE HELD FOR 5 DAYS BEFORE ISSUING A FINAL NEGATIVE REPORT   Report Status PENDING   Incomplete   URINE CULTURE     Status: Normal   Collection Time   07/06/12  7:13 PM      Component Value Range Status Comment   Specimen Description URINE, CLEAN CATCH   Final    Special Requests NONE   Final    Culture  Setup Time 07/07/2012 10:17   Final    Colony Count 20,OOO COLONIES/ML   Final    Culture     Final    Value: Multiple bacterial morphotypes present, none predominant. Suggest appropriate recollection if clinically indicated.   Report Status 07/08/2012 FINAL   Final      Studies: Dg Chest 2 View 07/06/2012  *RADIOLOGY REPORT*  Clinical Data: Congestion and shortness of breath.  CHEST - 2 VIEW  Comparison: None  Findings: The cardiac silhouette,  mediastinal and hilar contours are normal.  The lungs are clear.  No pleural effusion.  The bony thorax is intact.  IMPRESSION: No acute cardiopulmonary findings.   Original Report Authenticated By: Rudie Meyer, M.D.    Dg Foot Complete Left 07/08/2012  *RADIOLOGY REPORT*  Clinical Data: Pain  LEFT FOOT - COMPLETE 3+ VIEW  Comparison: None.  Findings: Frontal,  oblique, and lateral views were obtained.  There is no fracture or dislocation.  Joint spaces appear intact.  No erosive change. 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  IMPRESSION: No fracture or dislocation.  No erosive change.   Original Report Authenticated By: Bretta Bang, M.D.    Dg Foot Complete Right 07/08/2012  *RADIOLOGY REPORT*  Clinical Data: Pain  RIGHT FOOT COMPLETE - 3+ VIEW  Comparison: None.  Findings:  Frontal, oblique, and lateral views were obtained.  No fracture or dislocation.  Joint spaces appear intact.  No erosive change.  IMPRESSION: No fracture or appreciable arthropathic change.   Original Report Authenticated By: Bretta Bang, M.D.     Scheduled Meds:  . atorvastatin  20 mg Oral Daily  . cefTRIAXone   1 g Intravenous Q24H  . colchicine  0.6 mg Oral Daily  . divalproex  500 mg Oral Daily  . enoxaparin (LOVENOX)   40 mg Subcutaneous Q24H  . ibuprofen  400 mg Oral TID  . predniSONE  20 mg Oral Q breakfast  . vancomycin  750 mg Intravenous BID

## 2012-07-09 DIAGNOSIS — E876 Hypokalemia: Secondary | ICD-10-CM | POA: Diagnosis present

## 2012-07-09 DIAGNOSIS — D649 Anemia, unspecified: Secondary | ICD-10-CM | POA: Diagnosis present

## 2012-07-09 MED ORDER — PANTOPRAZOLE SODIUM 40 MG PO TBEC
40.0000 mg | DELAYED_RELEASE_TABLET | Freq: Every day | ORAL | Status: DC
  Administered 2012-07-09 – 2012-07-10 (×2): 40 mg via ORAL
  Filled 2012-07-09 (×2): qty 1

## 2012-07-09 MED ORDER — COLCHICINE 0.6 MG PO TABS
0.6000 mg | ORAL_TABLET | Freq: Two times a day (BID) | ORAL | Status: DC
  Administered 2012-07-10: 0.6 mg via ORAL
  Filled 2012-07-09 (×2): qty 1

## 2012-07-09 MED ORDER — POTASSIUM CHLORIDE IN NACL 40-0.9 MEQ/L-% IV SOLN
INTRAVENOUS | Status: DC
  Administered 2012-07-09 – 2012-07-10 (×2): via INTRAVENOUS
  Filled 2012-07-09 (×3): qty 1000

## 2012-07-09 MED ORDER — SODIUM CHLORIDE 0.9 % IV SOLN: INTRAVENOUS | Status: DC

## 2012-07-09 MED ORDER — COLCHICINE 0.6 MG PO TABS
0.6000 mg | ORAL_TABLET | ORAL | Status: AC
  Administered 2012-07-09 (×2): 0.6 mg via ORAL
  Filled 2012-07-09 (×2): qty 1

## 2012-07-09 MED ORDER — NAPROXEN 500 MG PO TABS
500.0000 mg | ORAL_TABLET | Freq: Two times a day (BID) | ORAL | Status: DC
  Administered 2012-07-09 – 2012-07-10 (×2): 500 mg via ORAL
  Filled 2012-07-09 (×4): qty 1

## 2012-07-09 NOTE — Progress Notes (Signed)
[facility] PROGRESS NOTE  Jose Wright EXB:[id number] DOB: 1941-07-27 DOA: 07/06/2012 PCP: No primary provider on file.  Goes to [place] in [place].  Brief narrative: My Pline is a 71 year old man with a PMH of hyperlipidemia and gout who was admitted with flu-like symptoms and foot pain on 07/07/12.  Assessment/Plan: Principal Problem:  *Influenza-like illness  Influenza studies negative.  Supportive care. Active Problems:  Normocytic anemia  Hemoglobin slowly trending down over time.  Likely AOCD.  Dehydration  Hydrating.  Pain in both feet secondary to gout flare  Plain films done on feet 07/08/12 negative for fracture, other pathology.  Uric acid level 6.9, ESR 72.  Spoke with Dr. Lestine Box (on call orthopedic MD) who felt that the patient's presentation is consistent with gout despite normal uric acid levels.  Recommended stepping up colchicine therapy, and offered to formally consult if no improvement in 24 hours.  Also on Naprosyn and prednisone.  Since cellulitis in differential, also on Vancomycin.  (D/C Rocephin as patient does not have a UTI).  Hyperlipidemia  Continue Lipitor.  Hypokalemia  Potassium added to IVF.  Monitor.   Code Status: Full. Family Communication: None at bedside. Disposition Plan: Home when stable.   Medical Consultants:  Telephone consultation with Dr. Lestine Box.  Other Consultants:  Physical therapy: Home health PT  Anti-infectives:  Vancomycin 07/08/12--->  Rocephin 07/07/12--->07/09/12  HPI/Subjective: Jose Wright is still having significant foot pain and swelling, to the point where he is unable to bear weight.  He has not had any appreciable response to colchicine, prednisone or vancomycin.  His uric acid levels were not elevated.  In the past, his gout has only affected his great toe.    Objective: Filed Vitals:   07/08/12 2139 07/09/12 0644 07/09/12 0830 07/09/12 1406  BP: 137/74 117/74  137/73  Pulse: 78 82  90    Temp: 97.9 F (36.6 C) 100.6 F (38.1 C) 98.1 F (36.7 C) 99.5 F (37.5 C)  TempSrc: Axillary Oral Oral Oral  Resp: 22 16  18   Height:      Weight:  73.664 kg (162 lb 6.4 oz)    SpO2: 100% 96%  99%    Intake/Output Summary (Last 24 hours) at 07/09/12 1603 Last data filed at 07/09/12 1525  Gross per 24 hour  Intake   1745 ml  Output   1820 ml  Net    -75 ml    Exam: Gen:  NAD Cardiovascular:  RRR, No M/R/G Respiratory:  Lungs CTAB Gastrointestinal:  Abdomen soft, NT/ND, + BS Extremities:  RLE > LLE swelling, erythema involving ankles.  Left great toe erythema over metatarsal head.  Data Reviewed: Basic Metabolic Panel:  Lab 07/08/12 1027 07/07/12 0357 07/06/12 1846  NA 137 136 132*  K 3.3* 3.2* --  CL 105 104 93*  CO2 22 22 25   GLUCOSE 87 108* 136*  BUN 17 15 19   CREATININE 0.95 0.97 1.14  CALCIUM 7.9* 7.8* 9.3  MG -- -- --  PHOS -- -- --   GFR Estimated Creatinine Clearance: 65.3 ml/min (by C-G formula based on Cr of 0.95). Liver Function Tests:  Lab 07/06/12 1846  AST 37  ALT 41  ALKPHOS 138*  BILITOT 0.9  PROT 8.2  ALBUMIN 3.4*   CBC:  Lab 07/08/12 0539 07/07/12 0357 07/06/12 1846  WBC 10.8* 9.7 10.4  NEUTROABS -- -- 6.8  HGB 10.9* 11.2* 14.0  HCT 32.6* 31.9* 40.0  MCV 93.9 91.9 92.6  PLT 241 248 297  Urinalysis    Component Value Date/Time   COLORURINE ORANGE* 07/06/2012 1913   APPEARANCEUR CLOUDY* 07/06/2012 1913   LABSPEC 1.035* 07/06/2012 1913   PHURINE 6.0 07/06/2012 1913   GLUCOSEU NEGATIVE 07/06/2012 1913   HGBUR MODERATE* 07/06/2012 1913   BILIRUBINUR MODERATE* 07/06/2012 1913   KETONESUR TRACE* 07/06/2012 1913   PROTEINUR 100* 07/06/2012 1913   UROBILINOGEN 4.0* 07/06/2012 1913   NITRITE POSITIVE* 07/06/2012 1913   LEUKOCYTESUR SMALL* 07/06/2012 1913   Microbiology Recent Results (from the past 240 hour(s))  CULTURE, BLOOD (ROUTINE X 2)     Status: Normal (Preliminary result)   Collection Time   07/06/12  6:46 PM      Component  Value Range Status Comment   Specimen Description BLOOD RIGHT ANTECUBITAL   Final    Special Requests BOTTLES DRAWN AEROBIC AND ANAEROBIC 5CC   Final    Culture  Setup Time 07/07/2012 09:45   Final    Culture     Final    Value:        BLOOD CULTURE RECEIVED NO GROWTH TO DATE CULTURE WILL BE HELD FOR 5 DAYS BEFORE ISSUING A FINAL NEGATIVE REPORT   Report Status PENDING   Incomplete   CULTURE, BLOOD (ROUTINE X 2)     Status: Normal (Preliminary result)   Collection Time   07/06/12  7:07 PM      Component Value Range Status Comment   Specimen Description BLOOD LEFT ARM   Final    Special Requests BOTTLES DRAWN AEROBIC AND ANAEROBIC [facility] EACH   Final    Culture  Setup Time 07/07/2012 09:45   Final    Culture     Final    Value:        BLOOD CULTURE RECEIVED NO GROWTH TO DATE CULTURE WILL BE HELD FOR 5 DAYS BEFORE ISSUING A FINAL NEGATIVE REPORT   Report Status PENDING   Incomplete   URINE CULTURE     Status: Normal   Collection Time   07/06/12  7:13 PM      Component Value Range Status Comment   Specimen Description URINE, CLEAN CATCH   Final    Special Requests NONE   Final    Culture  Setup Time 07/07/2012 10:17   Final    Colony Count 20,OOO COLONIES/ML   Final    Culture     Final    Value: Multiple bacterial morphotypes present, none predominant. Suggest appropriate recollection if clinically indicated.   Report Status 07/08/2012 FINAL   Final      Procedures and Diagnostic Studies:  Dg Chest 2 View 07/06/2012  IMPRESSION: No acute cardiopulmonary findings.   Original Report Authenticated By: Rudie Meyer, M.D.     Dg Foot Complete Left 07/08/2012 IMPRESSION: No fracture or dislocation.  No erosive change.   Original Report Authenticated By: Bretta Bang, M.D.     Dg Foot Complete Right 07/08/2012 IMPRESSION: No fracture or appreciable arthropathic change.   Original Report Authenticated By: Bretta Bang, M.D.     Scheduled Meds:    . atorvastatin  20 mg Oral Daily    . cefTRIAXone (ROCEPHIN)  IV  1 g Intravenous Q24H  . [COMPLETED] colchicine  0.6 mg Oral Q2H  . colchicine  0.6 mg Oral BID  . divalproex  500 mg Oral Daily  . enoxaparin (LOVENOX) injection  40 mg Subcutaneous Q24H  . [COMPLETED] ibuprofen  400 mg Oral TID  . naproxen  500 mg Oral BID WC  . pantoprazole  40 mg Oral Daily  . [COMPLETED] potassium chloride  10 mEq Intravenous Q1 Hr x 3  . predniSONE  20 mg Oral Q breakfast  . vancomycin  750 mg Intravenous BID  . [DISCONTINUED] colchicine  0.6 mg Oral Daily   Continuous Infusions:    . 0.9 % NaCl with KCl 40 mEq / L 75 mL/hr at 07/09/12 1213  . [DISCONTINUED] sodium chloride      Time spent: 35 minutes.   LOS: 3 days   RAMA,CHRISTINA  [facility] Pager [phone].  If 8PM-8AM, please contact night-coverage at [email or web address], password [facility] 07/09/2012, 4:03 PM

## 2012-07-09 NOTE — Progress Notes (Signed)
Physical Therapy Treatment Patient Details Name: Jose Wright MRN: [id number] DOB: December 22, 1940 Today's Date: 07/09/2012 Time: 4098-1191 PT Time Calculation (min): 35 min  PT Assessment / Plan / Recommendation Comments on Treatment Session  Pt continues to be limited by LE edema and pain.  Left great toe is reddened, but he is able to bear weight on LLE today enough to stand.  RLE is painful and swollen and ankle and knee.  Pt is not able to bear weight on RLE or even do much with ROM    Follow Up Recommendations  SNF;Supervision/Assistance - 24 hour ([facility]     Does the patient have the potential to tolerate intense rehabilitation     Barriers to Discharge        Equipment Recommendations  Rolling walker with 5" wheels;3 in 1 bedside comode    Recommendations for Other Services    Frequency Min 3X/week   Plan Discharge plan needs to be updated    Precautions / Restrictions     Pertinent Vitals/Pain Pt c/o severe pain in edematous Rknee and ankle and L ankle    Mobility  Bed Mobility Bed Mobility: Rolling Right;Rolling Left Rolling Right: 6: Modified independent (Device/Increase time);With rail Rolling Left: 6: Modified independent (Device/Increase time);With rail Details for Bed Mobility Assistance: extra tme for rolling to left Transfers Transfers: Sit to Stand;Stand to Sit;Squat Pivot Transfers Sit to Stand: 1: +2 Total assist;From elevated surface (NWB on Right leg due to pain and swelling) Sit to Stand: Patient Percentage: 50% Stand to Sit: 3: Mod assist Details for Transfer Assistance: Pt unable to even attempt to weight bear throught RLE due to pain and swelling.  He was able to bear weight througth LLE,  and stand erect with hips extended under trunk  He stood x2.  On third attempt , he performed squat pivot to chair, but had difficulty plantatflexing foot to pivot.  Recommend use of stedy for return to bed Nursing  informed Ambulation/Gait Ambulation/Gait Assistance: Not tested (comment) Assistive device: Rolling walker Stairs: No Wheelchair Mobility Wheelchair Mobility: No    Exercises General Exercises - Lower Extremity Ankle Circles/Pumps: AROM;Right;Left;10 reps;Supine;Seated (not able to do much with RLE due to apin, sweling) Quad Sets: AROM;Both;10 reps;Supine (unable to fully extend right knee more swelling anterior kne) Gluteal Sets: AROM;Both;10 reps;Supine Short Arc Quad: AROM;10 reps;Supine Long Arc Quad: Other (comment);AROM;Both;5 reps;Seated (limited on RLE) Hip ABduction/ADduction: AROM;Both;10 reps;Sidelying Straight Leg Raises: AROM;Both;10 reps;Supine Hip Flexion/Marching: AROM;Both;10 reps;Supine Other Exercises Other Exercises: bridges x 10 in supine Other Exercises: deep breathing, abd sets   PT Diagnosis:    PT Problem List:   PT Treatment Interventions:     PT Goals Acute Rehab PT Goals PT Goal Formulation: With patient Time For Goal Achievement: 07/21/12 Potential to Achieve Goals: Good Pt will go Supine/Side to Sit: Independently PT Goal: Supine/Side to Sit - Progress: Progressing toward goal Pt will go Sit to Supine/Side: Independently PT Goal: Sit to Supine/Side - Progress: Progressing toward goal Pt will go Sit to Stand: with modified independence PT Goal: Sit to Stand - Progress: Progressing toward goal Pt will go Stand to Sit: with modified independence PT Goal: Stand to Sit - Progress: Progressing toward goal Pt will Ambulate: >150 feet;with supervision;with least restrictive assistive device PT Goal: Ambulate - Progress: Not progressing (due to pain in swelling in legs) Pt will Go Up / Down Stairs: 3-5 stairs;with min assist;with least restrictive assistive device  Visit  Information  Last PT Received On: 07/09/12 Assistance Needed: +2 Reason Eval/Treat Not Completed: Pain limiting ability to participate    Subjective Data  Subjective: It up into my  knee Patient Stated Goal: to get better   Cognition  Overall Cognitive Status: Appears within functional limits for tasks assessed/performed Arousal/Alertness: Awake/alert Orientation Level: Appears intact for tasks assessed Behavior During Session: [facility] for tasks performed    Balance  Static Sitting Balance Static Sitting - Balance Support: No upper extremity supported Static Sitting - Level of Assistance: 7: Independent  End of Session PT - End of Session Activity Tolerance: Patient limited by pain Patient left: in chair;with call bell/phone within reach Nurse Communication: Need for lift equipment   GP     Rosey Bath K. [place], [place] [phone] 07/09/2012, 3:32 PM

## 2012-07-10 DIAGNOSIS — M109 Gout, unspecified: Secondary | ICD-10-CM | POA: Diagnosis present

## 2012-07-10 DIAGNOSIS — R269 Unspecified abnormalities of gait and mobility: Secondary | ICD-10-CM | POA: Diagnosis present

## 2012-07-10 MED ORDER — COLCHICINE 0.6 MG PO TABS: 0.6000 mg | ORAL_TABLET | Freq: Two times a day (BID) | ORAL | Status: AC

## 2012-07-10 MED ORDER — NAPROXEN 500 MG PO TABS: 500.0000 mg | ORAL_TABLET | Freq: Two times a day (BID) | ORAL | Status: DC | PRN

## 2012-07-10 NOTE — Progress Notes (Signed)
Physical Therapy Treatment Patient Details Name: Jose Wright MRN: [id number] DOB: 1941-02-09 Today's Date: 07/10/2012 Time: 5621-3086 PT Time Calculation (min): 39 min  PT Assessment / Plan / Recommendation Comments on Treatment Session  Pt much improved today and asking if he would be able to go home as he is feeling much better. Pt is able to to perform bed mobility independently, transfer and walk with RW with min assist.  He does need help with steps and feels that his son will be able to assist.  He will need HHPT, RW and 3 in 1 for home.    Follow Up Recommendations  Home health PT;Supervision - Intermittent     Does the patient have the potential to tolerate intense rehabilitation     Barriers to Discharge        Equipment Recommendations  Rolling walker with 5" wheels;3 in 1 bedside comode    Recommendations for Other Services    Frequency Min 3X/week   Plan Discharge plan needs to be updated    Precautions / Restrictions Restrictions Weight Bearing Restrictions: Yes   Pertinent Vitals/Pain Pt c/o pain at 6/10 in right knee when he moves it in certain angles.  Pain much improved overall today    Mobility  Bed Mobility Bed Mobility: Supine to Sit Supine to Sit: 6: Modified independent (Device/Increase time) Details for Bed Mobility Assistance: still needs extra time, but better than yesterday Transfers Transfers: Sit to Stand;Stand to Sit Sit to Stand: From elevated surface;4: Min guard (NWB on Right leg due to pain and swelling) Stand to Sit: 6: Modified independent (Device/Increase time) Details for Transfer Assistance: cues to remind to push up with arms on armrests Ambulation/Gait Ambulation/Gait Assistance: Not tested (comment);4: Min assist Ambulation Distance (Feet): 200 Feet (100, 50, 25, 25) Assistive device: Rolling walker Ambulation/Gait Assistance Details: pt needs some cues for sequence and posture in standing and gait Gait Pattern: Step-to  pattern;Decreased step length - right;Decreased step length - left;Trunk flexed Gait velocity: decreased General Gait Details: still with some pain and edema in Right ankle and knee limiting gait.  left leg is much improved overall and able to accept weight in ambulation Pt has tendence to lean forward in gait, but is improved with trunk and hip extension. 'I don't have to muscle it so much" Stairs: Yes Stairs Assistance: 3: Mod assist Stairs Assistance Details (indicate cue type and reason): pt needs cues for sequence an postural cues and pt tends to lean forward over walker.  Technique much improved with trunk extension Pt reports he will have assit of son and his friend at time of discharge.  Stair Management Technique: No rails;Backwards;Step to pattern Number of Stairs: 2  Wheelchair Mobility Wheelchair Mobility: No    Exercises General Exercises - Lower Extremity Ankle Circles/Pumps: AROM;Right;Left;10 reps;Supine;Seated Quad Sets: AROM;Both;10 reps;Supine Gluteal Sets: AROM;Both;10 reps;Supine Short Arc Quad: AROM;10 reps;Supine Long Arc Quad: Other (comment);AROM;Both;5 reps;Seated (limited on RLE) Hip ABduction/ADduction: AROM;Both;10 reps;Sidelying Straight Leg Raises: AROM;Both;10 reps;Supine Hip Flexion/Marching: AROM;Both;10 reps;Supine Other Exercises Other Exercises: bridges x 10 in supine Other Exercises: deep breathing, abd sets   PT Diagnosis:    PT Problem List:   PT Treatment Interventions:     PT Goals Acute Rehab PT Goals PT Goal: Supine/Side to Sit - Progress: Met PT Goal: Sit to Supine/Side - Progress: Met PT Goal: Sit to Stand - Progress: Met PT Goal: Stand to Sit - Progress: Met PT Goal: Ambulate - Progress: Progressing toward goal PT Goal: Up/Down Stairs -  Progress: Progressing toward goal  Visit Information  Last PT Received On: 07/10/12 Assistance Needed: +2    Subjective Data  Subjective: I'd really like to go home Patient Stated Goal: to go  home today   Cognition  Overall Cognitive Status: Appears within functional limits for tasks assessed/performed Arousal/Alertness: Awake/alert Orientation Level: Appears intact for tasks assessed Behavior During Session: [facility] for tasks performed    Balance  Balance Balance Assessed: Yes Static Sitting Balance Static Sitting - Level of Assistance: 1: +2 Total assist  End of Session PT - End of Session Equipment Utilized During Treatment: Gait belt Activity Tolerance: Patient limited by fatigue Patient left: in chair;with call bell/phone within reach Nurse Communication: Mobility status   GP    Rosey Bath K. Manson Passey, [place] [phone] 07/10/2012, 11:00 AM

## 2012-07-10 NOTE — Discharge Summary (Addendum)
Physician Discharge Summary  Jose Wright:[id number] DOB: [birth year] DOA: 07/06/2012  PCP: No primary provider on file.  Admit date: 07/06/2012 Discharge date: 07/10/2012  Recommendations for Outpatient Follow-up:  1. F/U [facility] for ongoing gout symptoms.  Discharge Diagnoses:   Principal Problem:  *Influenza-like illness, likely viral  Active Problems:   Dehydration   Pain in both feet secondary to gout flare   Gout attack   Hyperlipidemia   Hypokalemia   Normocytic anemia   Gait abnormality secondary to gout flare   Discharge Condition: Improved.  Diet recommendation: Low sodium, heart healthy; Avoid organ meats.  History of present illness:  Mr. Jose Wright is a 71 year old man with a PMH of hyperlipidemia and gout who was admitted with flu-like symptoms and foot pain on 07/07/12.  Hospital Course by problem:  Principal Problem:  *Influenza-like illness  Influenza studies negative. Fever related to viral illness which improved through hospital stay.  NO EVIDENCE of sepsis, UTI or other infectious process. Supportive care provided. Active Problems:  Normocytic anemia  Hemoglobin slowly trending down over time. Likely AOCD. Dehydration  Hydrated. Pain in both feet secondary to gout flare  Plain films done on feet 07/08/12 negative for fracture, other pathology. Uric acid level 6.9, ESR 72.  Spoke with Dr. Lestine Box (on call orthopedic MD) who felt that the patient's presentation is consistent with gout despite normal uric acid levels. Recommended stepping up colchicine therapy, and offered to formally consult if no improvement in 24 hours.  The patient had dramatic improvement with the colchicine. Also on Naprosyn and prednisone. Since cellulitis in differential, also was treated with Vancomycin. (D/C Rocephin as patient does not have a UTI).  Given dramatic improvement, and clinical history most consistent with gout, will d/c home off antibiotics.  He was  given prescriptions for colchicine and Naprosyn.  No need for further steroids. Hyperlipidemia  Continue Lipitor. Hypokalemia  Given IV supplementation.  Medical Consultants:  Telephone consultation with Dr. Lestine Box. Other Consultants:  Physical therapy: Home health PT.  Discharge Exam: Filed Vitals:   07/10/12 0504  BP: 135/85  Pulse: 70  Temp: 98.2 F (36.8 C)  Resp: 18   Filed Vitals:   07/09/12 0830 07/09/12 1406 07/09/12 2126 07/10/12 0504  BP:  137/73 132/71 135/85  Pulse:  90 69 70  Temp: 98.1 F (36.7 C) 99.5 F (37.5 C) 98 F (36.7 C) 98.2 F (36.8 C)  TempSrc: Oral Oral Oral Oral  Resp:  18 20 18   Height:      Weight:      SpO2:  99% 98% 100%    Gen: NAD  Cardiovascular: RRR, No M/R/G  Respiratory: Lungs CTAB  Gastrointestinal: Abdomen soft, NT/ND, + BS  Extremities: RLE > LLE swelling, erythema involving ankles. Left great toe erythema over metatarsal head.  Erythema improved compared with exam 07/09/12.    Discharge Instructions  Discharge Orders    Future Orders Please Complete By Expires   Diet - low sodium heart healthy      Scheduling Instructions:   Avoid organ meats.   Increase activity slowly      Jose Wright       Call MD for:  temperature >100.4      Call MD for:      Scheduling Instructions:   Worsening gout pain.       Medication List     As of 07/10/2012 11:19 AM    TAKE these medications  acetaminophen 500 MG tablet   Commonly known as: TYLENOL   Take 1,000 mg by mouth every 6 (six) hours as needed. Pain      atorvastatin 40 MG tablet   Commonly known as: LIPITOR   Take 20 mg by mouth daily.      colchicine 0.6 MG tablet   Take 1 tablet (0.6 mg total) by mouth 2 (two) times daily.      diphenhydrAMINE 25 MG tablet   Commonly known as: BENADRYL   Take 25 mg by mouth every 6 (six) hours as needed.      divalproex 250 MG 24 hr tablet   Commonly known as: DEPAKOTE ER   Take 500 mg by mouth daily.       guaiFENesin 100 MG/5ML liquid   Commonly known as: ROBITUSSIN   Take 200 mg by mouth 3 (three) times daily as needed.      naproxen 500 MG tablet   Commonly known as: NAPROSYN   Take 1 tablet (500 mg total) by mouth 2 (two) times daily as needed (Gout pain, take with food.).      pseudoephedrine-acetaminophen 30-500 MG Tabs   Commonly known as: TYLENOL SINUS   Take 1 tablet by mouth every 4 (four) hours as needed. congestion          The results of significant diagnostics from this hospitalization (including imaging, microbiology, ancillary and laboratory) are listed below for reference.    Significant Diagnostic Studies: Dg Chest 2 View  07/06/2012  *RADIOLOGY REPORT*  Clinical Data: Congestion and shortness of breath.  CHEST - 2 VIEW  Comparison: None  Findings: The cardiac silhouette, mediastinal and hilar contours are normal.  The lungs are clear.  No pleural effusion.  The bony thorax is intact.  IMPRESSION: No acute cardiopulmonary findings.   Original Report Authenticated By: Rudie Meyer, M.D.    Dg Foot Complete Left  07/08/2012  *RADIOLOGY REPORT*  Clinical Data: Pain  LEFT FOOT - COMPLETE 3+ VIEW  Comparison: None.  Findings: Frontal, oblique, and lateral views were obtained.  There is no fracture or dislocation.  Joint spaces appear intact.  No erosive change. 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  IMPRESSION: No fracture or dislocation.  No erosive change.   Original Report Authenticated By: Bretta Bang, M.D.    Dg Foot Complete Right  07/08/2012  *RADIOLOGY REPORT*  Clinical Data: Pain  RIGHT FOOT COMPLETE - 3+ VIEW  Comparison: None.  Findings:  Frontal, oblique, and lateral views were obtained.  No fracture or dislocation.  Joint spaces appear intact.  No erosive change.  IMPRESSION: No fracture or appreciable arthropathic change.   Original Report Authenticated By: Bretta Bang, M.D.     Microbiology: Recent Results  (from the past 240 hour(s))  CULTURE, BLOOD (ROUTINE X 2)     Status: Normal (Preliminary result)   Collection Time   07/06/12  6:46 PM      Component Value Range Status Comment   Specimen Description BLOOD RIGHT ANTECUBITAL   Final    Special Requests BOTTLES DRAWN AEROBIC AND ANAEROBIC 5CC   Final    Culture  Setup Time 07/07/2012 09:45   Final    Culture     Final    Value:        BLOOD CULTURE RECEIVED NO GROWTH TO DATE CULTURE WILL BE HELD FOR 5 DAYS BEFORE ISSUING A FINAL NEGATIVE REPORT   Report Status PENDING   Incomplete  CULTURE, BLOOD (ROUTINE X 2)     Status: Normal (Preliminary result)   Collection Time   07/06/12  7:07 PM      Component Value Range Status Comment   Specimen Description BLOOD LEFT ARM   Final    Special Requests BOTTLES DRAWN AEROBIC AND ANAEROBIC [facility] EACH   Final    Culture  Setup Time 07/07/2012 09:45   Final    Culture     Final    Value:        BLOOD CULTURE RECEIVED NO GROWTH TO DATE CULTURE WILL BE HELD FOR 5 DAYS BEFORE ISSUING A FINAL NEGATIVE REPORT   Report Status PENDING   Incomplete   URINE CULTURE     Status: Normal   Collection Time   07/06/12  7:13 PM      Component Value Range Status Comment   Specimen Description URINE, CLEAN CATCH   Final    Special Requests NONE   Final    Culture  Setup Time 07/07/2012 10:17   Final    Colony Count 20,OOO COLONIES/ML   Final    Culture     Final    Value: Multiple bacterial morphotypes present, none predominant. Suggest appropriate recollection if clinically indicated.   Report Status 07/08/2012 FINAL   Final      Labs: Basic Metabolic Panel:  Lab 07/08/12 1610 07/07/12 0357 07/06/12 1846  NA 137 136 132*  K 3.3* 3.2* 3.6  CL 105 104 93*  CO2 22 22 25   GLUCOSE 87 108* 136*  BUN 17 15 19   CREATININE 0.95 0.97 1.14  CALCIUM 7.9* 7.8* 9.3  MG -- -- --  PHOS -- -- --   Liver Function Tests:  Lab 07/06/12 1846  AST 37  ALT 41  ALKPHOS 138*  BILITOT 0.9  PROT 8.2  ALBUMIN 3.4*    CBC:  Lab 07/08/12 0539 07/07/12 0357 07/06/12 1846  WBC 10.8* 9.7 10.4  NEUTROABS -- -- 6.8  HGB 10.9* 11.2* 14.0  HCT 32.6* 31.9* 40.0  MCV 93.9 91.9 92.6  PLT 241 248 297    Time coordinating discharge: 35 minutes.  Signed:  RAMA,CHRISTINA  Pager [phone] [facility] 07/10/2012, 11:19 AM

## 2012-07-10 NOTE — Progress Notes (Signed)
Pt ready for d/c to home with son. Went over d/c instructions. [facility]. Pt to start taking colchicine daily, and naproxen for pain as needed. Instructed to avoid organ meats and diet to prevent gout flare ups. PIV d/c'd WNL. Pt d/c'd to home with son.

## 2012-07-10 NOTE — Progress Notes (Signed)
   CARE MANAGEMENT NOTE 07/10/2012  Patient:  Jose Wright, Jose Wright   Account Number:  [id number]  Date Initiated:  07/07/2012  Documentation initiated by:  PEARSON,COOKIE  Subjective/Objective Assessment:   70yo pt admitted with cco flulike symptoms, T101.5, generalized weakness, aches, pains, poor appetite. Hyponatremic Dehydration.     Action/Plan:   from home   Anticipated DC Date:  07/09/2012   Anticipated DC Plan:  HOME/SELF CARE      DC Planning Services  CM consult      Choice offered to / List presented to:  C-1 Patient   DME arranged  3-N-1  Levan Hurst      DME agency  [facility]     HH arranged  HH-2 PT      [facility] agency  [facility]   Status of service:  In process, will continue to follow Medicare Important Message given?  NA - LOS <3 / Initial given by admissions (If response is "NO", the following Medicare IM given date fields will be blank) Date Medicare IM given:   Date Additional Medicare IM given:    Discharge Disposition:  HOME W HOME HEALTH SERVICES  Per UR Regulation:  Reviewed for med. necessity/level of care/duration of stay  If discussed at Long Length of Stay Meetings, dates discussed:    Comments:  07/10/12 MPearson, RN, BSN returned to speak with pt concerning discharge to home with home health.  From the home health agencies list pt selected [facility].  Referral was given. Information refaxed to [place] in [place].  Pt wants to go to the [facility].  07/09/12 MPearson, RN, BSN Spoke with pt concerning [facility]. Pt would ike to go. Information called and faxed to [facility] in [place], [place]  07/07/12 MPearson, RN, BSN Chart reviewed. Plan to dc home when stable.

## 2012-07-13 LAB — CULTURE, BLOOD (ROUTINE X 2): Culture: NO GROWTH

## 2013-02-05 ENCOUNTER — Other Ambulatory Visit: Payer: Self-pay | Admitting: Internal Medicine

## 2013-02-05 DIAGNOSIS — E785 Hyperlipidemia, unspecified: Secondary | ICD-10-CM

## 2013-02-12 ENCOUNTER — Ambulatory Visit
Admission: RE | Admit: 2013-02-12 | Discharge: 2013-02-12 | Disposition: A | Discharge status: Home / Self Care | Payer: Medicare Other | Source: Ambulatory Visit | Attending: Internal Medicine | Admitting: Internal Medicine

## 2013-02-12 DIAGNOSIS — E785 Hyperlipidemia, unspecified: Secondary | ICD-10-CM

## 2014-06-23 ENCOUNTER — Encounter: Payer: Self-pay | Admitting: Surgery

## 2014-07-02 ENCOUNTER — Encounter: Payer: Self-pay | Admitting: Surgery

## 2014-07-05 ENCOUNTER — Ambulatory Visit (INDEPENDENT_AMBULATORY_CARE_PROVIDER_SITE_OTHER): Payer: Medicare Other | Admitting: Surgery

## 2014-07-05 ENCOUNTER — Encounter: Payer: Self-pay | Admitting: Surgery

## 2014-07-05 VITALS — BP 153/97 | HR 64 | Temp 98.0°F | Resp 16 | Ht 66.0 in | Wt 159.3 lb

## 2014-07-05 DIAGNOSIS — Z48812 Encounter for surgical aftercare following surgery on the circulatory system: Secondary | ICD-10-CM | POA: Diagnosis not present

## 2014-07-05 DIAGNOSIS — I714 Abdominal aortic aneurysm, without rupture, unspecified: Secondary | ICD-10-CM

## 2014-07-05 NOTE — Addendum Note (Signed)
Addended by: Sharee PimpleMCCHESNEY, MARILYN K on: 07/05/2014 03:45 PM   Modules accepted: Orders

## 2014-07-05 NOTE — Patient Instructions (Signed)
Abdominal Aortic Aneurysm An aneurysm is a weakened or damaged part of an artery wall that bulges from the normal force of blood pumping through the body. An abdominal aortic aneurysm is an aneurysm that occurs in the lower part of the aorta, the main artery of the body.  The major concern with an abdominal aortic aneurysm is that it can enlarge and burst (rupture) or blood can flow between the layers of the wall of the aorta through a tear (aorticdissection). Both of these conditions can cause bleeding inside the body and can be life threatening unless diagnosed and treated promptly. CAUSES  The exact cause of an abdominal aortic aneurysm is unknown. Some contributing factors are:   A hardening of the arteries caused by the buildup of fat and other substances in the lining of a blood vessel (arteriosclerosis).  Inflammation of the walls of an artery (arteritis).   Connective tissue diseases, such as Marfan syndrome.   Abdominal trauma.   An infection, such as syphilis or staphylococcus, in the wall of the aorta (infectious aortitis) caused by bacteria. RISK FACTORS  Risk factors that contribute to an abdominal aortic aneurysm may include:  Age older than 60 years.   High blood pressure (hypertension).  Male gender.  Ethnicity (white race).  Obesity.  Family history of aneurysm (first degree relatives only).  Tobacco use. PREVENTION  The following healthy lifestyle habits may help decrease your risk of abdominal aortic aneurysm:  Quitting smoking. Smoking can raise your blood pressure and cause arteriosclerosis.  Limiting or avoiding alcohol.  Keeping your blood pressure, blood sugar level, and cholesterol levels within normal limits.  Decreasing your salt intake. In somepeople, too much salt can raise blood pressure and increase your risk of abdominal aortic aneurysm.  Eating a diet low in saturated fats and cholesterol.  Increasing your fiber intake by including  whole grains, vegetables, and fruits in your diet. Eating these foods may help lower blood pressure.  Maintaining a healthy weight.  Staying physically active and exercising regularly. SYMPTOMS  The symptoms of abdominal aortic aneurysm may vary depending on the size and rate of growth of the aneurysm.Most grow slowly and do not have any symptoms. When symptoms do occur, they may include:  Pain (abdomen, side, lower back, or groin). The pain may vary in intensity. A sudden onset of severe pain may indicate that the aneurysm has ruptured.  Feeling full after eating only small amounts of food.  Nausea or vomiting or both.  Feeling a pulsating lump in the abdomen.  Feeling faint or passing out. DIAGNOSIS  Since most unruptured abdominal aortic aneurysms have no symptoms, they are often discovered during diagnostic exams for other conditions. An aneurysm may be found during the following procedures:  Ultrasonography (A one-time screening for abdominal aortic aneurysm by ultrasonography is also recommended for all men aged 65-75 years who have ever smoked).  X-ray exams.  A computed tomography (CT).  Magnetic resonance imaging (MRI).  Angiography or arteriography. TREATMENT  Treatment of an abdominal aortic aneurysm depends on the size of your aneurysm, your age, and risk factors for rupture. Medication to control blood pressure and pain may be used to manage aneurysms smaller than 6 cm. Regular monitoring for enlargement may be recommended by your caregiver if:  The aneurysm is 3-4 cm in size (an annual ultrasonography may be recommended).  The aneurysm is 4-4.5 cm in size (an ultrasonography every 6 months may be recommended).  The aneurysm is larger than 4.5 cm in   size (your caregiver may ask that you be examined by a vascular surgeon). If your aneurysm is larger than 6 cm, surgical repair may be recommended. There are two main methods for repair of an aneurysm:   Endovascular  repair (a minimally invasive surgery). This is done most often.  Open repair. This method is used if an endovascular repair is not possible. Document Released: 05/30/2005 Document Revised: 12/15/2012 Document Reviewed: 09/19/2012 [employer] Patient Information 2015 [employer]. This information is not intended to replace advice given to you by your health care provider. Make sure you discuss any questions you have with your health care provider.  

## 2014-07-05 NOTE — Progress Notes (Signed)
Patient name: Jose Wright MRN: [id number] DOB: 07/14/1941 Sex: male   Referred by: Dr. Gerlene FeePiva  Reason for referral:  Chief Complaint  Patient presents with  . AAA    Patient also sees the [facility] in [place] for care    HISTORY OF PRESENT ILLNESS: This is a 73 year old gentleman who is referred for evaluation of an abdominal aortic aneurysm.  He initially underwent a lifeline screening evaluation which revealed a 3.2 cm infrarenal abdominal aortic aneurysm.  He ultimately ended up getting a CT angiogram to better evaluate this.  This shows that the aneurysm measures approximately 2.8 cm.  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  The patient denies any abdominal pain.  The patient suffers from hypercholesterolemia which is managed with a statin.  He tells me he takes Depakote for depression.  He is a nonsmoker.  The patient denies symptoms of claudication.  He denies abdominal pain or back pain.  He denies any neurologic symptoms such as amaurosis fugax numbness or weakness in either extremity, slurred speech.  Past Medical History  Diagnosis Date  . Hyperlipemia   . AAA (abdominal aortic aneurysm)     Past Surgical History  Procedure Laterality Date  . No past surgeries    . Tonsillectomy      age 22 years    History   Social History  . Marital Status: Married    Spouse Name: N/A    Number of Children: N/A  . Years of Education: N/A   Occupational History  . Not on file.   Social History Main Topics  . Smoking status: Never Smoker   . Smokeless tobacco: Not on file  . Alcohol Use: No  . Drug Use: No  . Sexual Activity: Not on file   Other Topics Concern  . Not on file   Social History Narrative    Family History  Problem Relation Age of Onset  . Hyperlipidemia Father   . Asthma Son     Allergies as of 07/05/2014  . (No Known Allergies)    Current Outpatient Prescriptions on File Prior to Visit  Medication Sig  Dispense Refill  . acetaminophen (TYLENOL) 500 MG tablet Take 1,000 mg by mouth every 6 (six) hours as needed. Pain    . atorvastatin (LIPITOR) 40 MG tablet Take 20 mg by mouth daily.    . colchicine 0.6 MG tablet Take 1 tablet (0.6 mg total) by mouth 2 (two) times daily. 60 tablet 3  . diphenhydrAMINE (BENADRYL) 25 MG tablet Take 25 mg by mouth every 6 (six) hours as needed.    . divalproex (DEPAKOTE ER) 250 MG 24 hr tablet Take 500 mg by mouth daily.    Marland Kitchen. guaiFENesin (ROBITUSSIN) 100 MG/5ML liquid Take 200 mg by mouth 3 (three) times daily as needed.    . naproxen (NAPROSYN) 500 MG tablet Take 1 tablet (500 mg total) by mouth 2 (two) times daily as needed (Gout pain, take with food.). 60 tablet 3  . pseudoephedrine-acetaminophen (TYLENOL SINUS) 30-500 MG TABS Take 1 tablet by mouth every 4 (four) hours as needed. congestion     No current facility-administered medications on file prior to visit.     REVIEW OF SYSTEMS: Cardiovascular: No chest pain, chest pressure, palpitations, orthopnea, or dyspnea on exertion. No claudication or rest pain,  No history of DVT or phlebitis. Pulmonary: No productive cough, asthma or wheezing. Neurologic: No weakness, paresthesias, aphasia, or amaurosis. No dizziness.  Hematologic: No bleeding problems or clotting disorders. Musculoskeletal: No joint pain or joint swelling. Gastrointestinal: No blood in stool or hematemesis Genitourinary: No dysuria or hematuria. Psychiatric:: No history of major depression. Integumentary: No rashes or ulcers. Constitutional: No fever or chills.  PHYSICAL EXAMINATION: General: The patient appears their stated age.  Vital signs are BP 153/97 mmHg  Pulse 64  Temp(Src) 98 F (36.7 C) (Oral)  Resp 16  Ht 5\' 6"  (1.676 m)  Wt 159 lb 4.8 oz (72.258 kg)  BMI 25.72 kg/m2  SpO2 98% HEENT:  No gross abnormalities Pulmonary: Respirations are non-labored Abdomen: Soft and non-tender  Musculoskeletal: There are no major  deformities.   Neurologic: No focal weakness or paresthesias are detected, Skin: There are no ulcer or rashes noted. Psychiatric: The patient has normal affect. Cardiovascular: There is a regular rate and rhythm without significant murmur appreciated.no carotid bruits.  Palpable femoral pulses.I do not feel popliteal aneurysm  Diagnostic Studies: I have reviewed the reports that came with the patient.  I have not seen any images.  CT angiogram shows a 2.8 cm infrarenal abdominal aortic aneurysm associated with chronic dissection.    Assessment:  Abdominal aortic aneurysm Plan: I discussed the management with the patient of a 2.8 cm aneurysm.  I think that he needs long-term follow-up.  He is at very low risk for rupture currently.  I suggested a follow-up in 2 years, however he would be more comfortable with a follow-up in 1 year.  At that time I will also get a lower extremity Doppler study to evaluate for popliteal aneurysm.     Jorge NyV. Wells Kimara Bencomo IV, M.D. [facility] [place] Office: [phone] Pager:  [phone]

## 2015-07-07 ENCOUNTER — Encounter: Payer: Self-pay | Admitting: Family

## 2015-07-11 ENCOUNTER — Other Ambulatory Visit (HOSPITAL_COMMUNITY): Payer: Medicare Other

## 2015-07-11 ENCOUNTER — Encounter: Payer: Self-pay | Admitting: Family

## 2015-07-11 ENCOUNTER — Ambulatory Visit: Payer: Medicare Other | Admitting: Family

## 2015-07-11 ENCOUNTER — Ambulatory Visit (INDEPENDENT_AMBULATORY_CARE_PROVIDER_SITE_OTHER)
Admission: RE | Admit: 2015-07-11 | Discharge: 2015-07-11 | Disposition: A | Discharge status: Home / Self Care | Payer: Medicare Other | Source: Ambulatory Visit | Attending: Surgery | Admitting: Surgery

## 2015-07-11 ENCOUNTER — Ambulatory Visit (HOSPITAL_COMMUNITY)
Admission: RE | Admit: 2015-07-11 | Discharge: 2015-07-11 | Disposition: A | Discharge status: Home / Self Care | Payer: Medicare Other | Source: Ambulatory Visit | Attending: Family | Admitting: Family

## 2015-07-11 ENCOUNTER — Ambulatory Visit (INDEPENDENT_AMBULATORY_CARE_PROVIDER_SITE_OTHER): Payer: Medicare Other | Admitting: Family

## 2015-07-11 ENCOUNTER — Other Ambulatory Visit: Payer: Self-pay | Admitting: Surgery

## 2015-07-11 VITALS — BP 127/88 | HR 65 | Ht 66.0 in | Wt 154.1 lb

## 2015-07-11 DIAGNOSIS — Z48812 Encounter for surgical aftercare following surgery on the circulatory system: Secondary | ICD-10-CM

## 2015-07-11 DIAGNOSIS — I6529 Occlusion and stenosis of unspecified carotid artery: Secondary | ICD-10-CM

## 2015-07-11 DIAGNOSIS — I714 Abdominal aortic aneurysm, without rupture, unspecified: Secondary | ICD-10-CM

## 2015-07-11 DIAGNOSIS — I7102 Dissection of abdominal aorta: Secondary | ICD-10-CM | POA: Diagnosis not present

## 2015-07-11 NOTE — Progress Notes (Signed)
[facility] [place]  Established Abdominal Aortic chronic dissection   History of Present Illness  Jose Wright is a 74 y.o. ([birth year]) male patient of Dr. Myra GianottiBrabham who was initially referred for evaluation of an abdominal aortic aneurysm. He initially underwent a lifeline screening evaluation which revealed a 3.2 cm infrarenal abdominal aortic aneurysm. He ultimately ended up getting a CT angiogram to better evaluate this. This shows that the aneurysm measures approximately 2.8 cm. 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 The patient denies any abdominal pain.  The patient suffers from hypercholesterolemia which is managed with a statin. He reports taking Depakote for depression. He is a nonsmoker.  The patient denies symptoms of claudication. He denies abdominal pain or back pain. He denies any neurologic symptoms such as amaurosis fugax numbness or weakness in either extremity, slurred speech.  He denies any new medical problems or surgeries.  The patient denies claudication in legs with walking. The patient denies history of stroke or TIA symptoms. Pt states it was found on some screening that he has carotid artery plaque.  Pt Diabetic: No Pt smoker: non-smoker  Past Medical History  Diagnosis Date  . Hyperlipemia   . AAA (abdominal aortic aneurysm) [facility](HCC)    Past Surgical History  Procedure Laterality Date  . No past surgeries    . Tonsillectomy      age 89 years   Social History Social History   Social History  . Marital Status: Married    Spouse Name: N/A  . Number of Children: N/A  . Years of Education: N/A   Occupational History  . Not on file.   Social History Main Topics  . Smoking status: Never Smoker   . Smokeless tobacco: Not on file  . Alcohol Use: No  . Drug Use: No  . Sexual Activity: Not on file   Other Topics Concern  . Not on file   Social History Narrative   Family  History Family History  Problem Relation Age of Onset  . Hyperlipidemia Father   . Asthma Son     Current Outpatient Prescriptions on File Prior to Visit  Medication Sig Dispense Refill  . acetaminophen (TYLENOL) 500 MG tablet Take 1,000 mg by mouth every 6 (six) hours as needed. Pain    . atorvastatin (LIPITOR) 40 MG tablet Take 20 mg by mouth daily.    . colchicine 0.6 MG tablet Take 1 tablet (0.6 mg total) by mouth 2 (two) times daily. 60 tablet 3  . diphenhydrAMINE (BENADRYL) 25 MG tablet Take 25 mg by mouth every 6 (six) hours as needed.    . divalproex (DEPAKOTE ER) 250 MG 24 hr tablet Take 500 mg by mouth daily.    Marland Kitchen. guaiFENesin (ROBITUSSIN) 100 MG/5ML liquid Take 200 mg by mouth 3 (three) times daily as needed.    . naproxen (NAPROSYN) 500 MG tablet Take 1 tablet (500 mg total) by mouth 2 (two) times daily as needed (Gout pain, take with food.). 60 tablet 3  . pseudoephedrine-acetaminophen (TYLENOL SINUS) 30-500 MG TABS Take 1 tablet by mouth every 4 (four) hours as needed. congestion     No current facility-administered medications on file prior to visit.   No Known Allergies  ROS: See HPI for pertinent positives and negatives.  Physical Examination  Filed Vitals:   07/11/15 0909  BP: 127/88  Pulse: 65  Height: 5\' 6"  (1.676 m)  Weight: 154 lb 1.6 oz (69.899 kg)  SpO2:  97%   Body mass index is 24.88 kg/(m^2).  General: A&O x 3, WD.  Pulmonary: Sym exp, good air movt, CTAB, no rales, rhonchi, or wheezing.  Cardiac: RRR, Nl S1, S2, no detected murmur.   Carotid Bruits Right Left   Negative Negative   Aorta is not palpable Radial pulses are 2+ palpable and =                          VASCULAR EXAM:                                                                                                         LE Pulses Right Left       FEMORAL  2+ palpable  2+ palpable        POPLITEAL  2+ palpable   2+ palpable       POSTERIOR TIBIAL  2+ palpable   2+ palpable         DORSALIS PEDIS      ANTERIOR TIBIAL 2+ palpable  2+ palpable      Gastrointestinal: soft, NTND, -G/R, - HSM, - masses palpated, - CVAT B.  Musculoskeletal: M/S 5/5 throughout, Extremities without ischemic changes.  Neurologic: CN 2-12 intact, Pain and light touch intact in extremities are intact, Motor exam as listed above.  Non-Invasive Vascular Imaging  AAA Duplex (07/11/2015) Patent abdominal aorta without evidence of aneurysm. Known 1 cm long chronic dissection.   Popliteal artery Duplex: No evidence of popliteal artery aneurysm in the right or left lower extremity.   Medical Decision Making  The patient is a 74 y.o. male who presents with asymptomatic known 1 cm long chronic dissection of abdominal aorta, no increase in size, no evidence of aneurysm.  No popliteal aneurysm in either leg. He has no claudication symptoms with walking.  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  Dr. Myra Gianotti spoke with patient.    Based on this patient's exam and diagnostic studies, the patient will follow up in 1 year  with the following studies: AAA duplex, carotid duplex.  Consideration for repair of AAA would be made when the size is 5.5 cm, growth > 1 cm/yr, and symptomatic status.  I emphasized the importance of maximal medical management including strict control of blood pressure, blood glucose, and lipid levels, antiplatelet agents, obtaining regular exercise, and continued cessation of smoking.   The patient was given information about AAA including signs, symptoms, treatment, and how to minimize the risk of enlargement and rupture of aneurysms.    The patient was advised to call [phone] should the patient experience sudden onset abdominal or back pain.   Thank you for allowing [place] to participate in this patient's care.  Charisse March, RN, MSN, FNP-C [facility] [place] Office:  [phone]  Clinic Physician: Myra Gianotti  07/11/2015, 9:17 AM

## 2016-03-13 DIAGNOSIS — Z0000 Encounter for general adult medical examination without abnormal findings: Secondary | ICD-10-CM | POA: Diagnosis not present

## 2016-07-16 ENCOUNTER — Inpatient Hospital Stay (HOSPITAL_COMMUNITY)
Admission: RE | Admit: 2016-07-16 | Discharge: 2016-07-16 | Disposition: A | Discharge status: Home / Self Care | Payer: Medicare Other | Source: Ambulatory Visit | Attending: Family | Admitting: Family

## 2016-07-16 ENCOUNTER — Inpatient Hospital Stay (HOSPITAL_COMMUNITY)
Admission: RE | Admit: 2016-07-16 | Discharge: 2016-07-16 | Disposition: A | Discharge status: Home / Self Care | Payer: Medicare Other | Source: Ambulatory Visit

## 2016-07-16 ENCOUNTER — Ambulatory Visit: Payer: Medicare Other | Admitting: Family

## 2016-07-16 DIAGNOSIS — I7102 Dissection of abdominal aorta: Secondary | ICD-10-CM

## 2016-07-16 DIAGNOSIS — I6529 Occlusion and stenosis of unspecified carotid artery: Secondary | ICD-10-CM

## 2016-07-16 DIAGNOSIS — I714 Abdominal aortic aneurysm, without rupture, unspecified: Secondary | ICD-10-CM

## 2017-01-03 ENCOUNTER — Other Ambulatory Visit (HOSPITAL_COMMUNITY): Payer: Medicare Other

## 2017-01-03 ENCOUNTER — Ambulatory Visit: Payer: Medicare Other | Admitting: Family

## 2017-01-03 ENCOUNTER — Encounter (HOSPITAL_COMMUNITY): Payer: Medicare Other

## 2017-01-08 ENCOUNTER — Ambulatory Visit: Payer: Medicare Other | Admitting: Family

## 2017-07-04 ENCOUNTER — Other Ambulatory Visit: Payer: Self-pay

## 2017-07-04 DIAGNOSIS — I6529 Occlusion and stenosis of unspecified carotid artery: Secondary | ICD-10-CM

## 2017-07-04 DIAGNOSIS — I714 Abdominal aortic aneurysm, without rupture, unspecified: Secondary | ICD-10-CM

## 2017-07-22 ENCOUNTER — Ambulatory Visit (INDEPENDENT_AMBULATORY_CARE_PROVIDER_SITE_OTHER)
Admission: RE | Admit: 2017-07-22 | Discharge: 2017-07-22 | Disposition: A | Discharge status: Home / Self Care | Payer: Medicare Other | Source: Ambulatory Visit | Attending: Family | Admitting: Family

## 2017-07-22 ENCOUNTER — Ambulatory Visit (HOSPITAL_COMMUNITY)
Admission: RE | Admit: 2017-07-22 | Discharge: 2017-07-22 | Disposition: A | Discharge status: Home / Self Care | Payer: Medicare Other | Source: Ambulatory Visit | Attending: Family | Admitting: Family

## 2017-07-22 ENCOUNTER — Ambulatory Visit (INDEPENDENT_AMBULATORY_CARE_PROVIDER_SITE_OTHER): Payer: Medicare Other | Admitting: Family

## 2017-07-22 ENCOUNTER — Encounter: Payer: Self-pay | Admitting: Family

## 2017-07-22 VITALS — BP 153/87 | HR 57 | Temp 96.9°F | Resp 18 | Wt 155.8 lb

## 2017-07-22 DIAGNOSIS — I6529 Occlusion and stenosis of unspecified carotid artery: Secondary | ICD-10-CM

## 2017-07-22 DIAGNOSIS — I714 Abdominal aortic aneurysm, without rupture, unspecified: Secondary | ICD-10-CM

## 2017-07-22 DIAGNOSIS — I6522 Occlusion and stenosis of left carotid artery: Secondary | ICD-10-CM

## 2017-07-22 DIAGNOSIS — I7101 Dissection of thoracic aorta: Secondary | ICD-10-CM | POA: Insufficient documentation

## 2017-07-22 LAB — VAS US CAROTID
LCCAPDIAS: 24 cm/s
LCCAPSYS: 88 cm/s
LEFT ECA DIAS: -16 cm/s
LEFT VERTEBRAL DIAS: -18 cm/s
LICAPDIAS: -19 cm/s
Left CCA dist dias: -24 cm/s
Left CCA dist sys: -73 cm/s
Left ICA dist dias: -28 cm/s
Left ICA dist sys: -80 cm/s
Left ICA prox sys: -59 cm/s
RCCAPDIAS: 27 cm/s
RCCAPSYS: 82 cm/s
RIGHT CCA MID DIAS: 26 cm/s
RIGHT ECA DIAS: -26 cm/s
RIGHT VERTEBRAL DIAS: 13 cm/s
Right cca dist sys: 75 cm/s

## 2017-07-22 NOTE — Progress Notes (Signed)
[facility] [place] HISTORY AND PHYSICAL   MRN : [id number]  History of Present Illness:   Jose Wright is a 76 y.o. male patient of Dr. Myra GianottiBrabham who was initially referred for evaluation of an abdominal aortic aneurysm. He initially underwent a lifeline screening evaluation which revealed a 3.2 cm infrarenal abdominal aortic aneurysm. He ultimately ended up getting a CT angiogram to better evaluate this. This shows that the aneurysm measures approximately 2.8 cm. 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   The patient suffers from hypercholesterolemia which is managed with a statin. He reports taking Depakote for depression. He is a nonsmoker.  He denies abdominal pain or back pain. He denies any neurologic symptoms such as amaurosis fugax numbness or weakness in either extremity, slurred speech.  He denies any new medical problems or surgeries.  The patient denies claudication in legs with walking. The patient denies history of stroke or TIA symptoms. Pt states it was found on some screening that he has carotid artery plaque.  Pt Diabetic: No Pt smoker: non-smoker    Current Outpatient Medications  Medication Sig Dispense Refill  . acetaminophen (TYLENOL) 500 MG tablet Take 1,000 mg by mouth every 6 (six) hours as needed. Pain    . allopurinol (ZYLOPRIM) 100 MG tablet Take 100 mg by mouth daily.     Marland Kitchen. atorvastatin (LIPITOR) 40 MG tablet Take 20 mg by mouth daily.    . colchicine 0.6 MG tablet Take 1 tablet (0.6 mg total) by mouth 2 (two) times daily. 60 tablet 3  . diphenhydrAMINE (BENADRYL) 25 MG tablet Take 25 mg by mouth every 6 (six) hours as needed.    . divalproex (DEPAKOTE ER) 250 MG 24 hr tablet Take 500 mg by mouth daily.    . pseudoephedrine-acetaminophen (TYLENOL SINUS) 30-500 MG TABS Take 1 tablet by mouth every 4 (four) hours as needed. congestion     No current facility-administered medications for this  visit.     Past Medical History:  Diagnosis Date  . AAA (abdominal aortic aneurysm) (HCC)   . Hyperlipemia     Social History Social History   Tobacco Use  . Smoking status: Never Smoker  . Smokeless tobacco: Never Used  Substance Use Topics  . Alcohol use: No  . Drug use: No    Family History Family History  Problem Relation Age of Onset  . Hyperlipidemia Father   . Asthma Son     Surgical History Past Surgical History:  Procedure Laterality Date  . NO PAST SURGERIES    . TONSILLECTOMY     age 17 years    No Known Allergies  Current Outpatient Medications  Medication Sig Dispense Refill  . acetaminophen (TYLENOL) 500 MG tablet Take 1,000 mg by mouth every 6 (six) hours as needed. Pain    . allopurinol (ZYLOPRIM) 100 MG tablet Take 100 mg by mouth daily.     Marland Kitchen. atorvastatin (LIPITOR) 40 MG tablet Take 20 mg by mouth daily.    . colchicine 0.6 MG tablet Take 1 tablet (0.6 mg total) by mouth 2 (two) times daily. 60 tablet 3  . diphenhydrAMINE (BENADRYL) 25 MG tablet Take 25 mg by mouth every 6 (six) hours as needed.    . divalproex (DEPAKOTE ER) 250 MG 24 hr tablet Take 500 mg by mouth daily.    . pseudoephedrine-acetaminophen (TYLENOL SINUS) 30-500 MG TABS Take 1 tablet by mouth every 4 (four) hours as needed. congestion  No current facility-administered medications for this visit.      REVIEW OF SYSTEMS: See HPI for pertinent positives and negatives.  Physical Examination Vitals:   07/22/17 0851 07/22/17 0854  BP: (!) 153/90 (!) 153/87  Pulse: (!) 57   Resp: 18   Temp: (!) 96.9 F (36.1 C)   TempSrc: Oral   SpO2: 99%   Weight: 155 lb 12.8 oz (70.7 kg)    Body mass index is 25.15 kg/m.  General:  A&O x 3, WDWN male.  Pulmonary: Sym exp, respirations are non labored, good air movt, CTAB, no rales, rhonchi, or wheezing.  Cardiac: RRR, Nl S1, S2, no detected murmur.   Carotid Bruits Right Left   Negative Negative    Abdominal aortic  pulse is not palpable Radial pulses are 2+ palpable and =                          VASCULAR EXAM:                                                                                                                                           LE Pulses Right Left       FEMORAL  2+ palpable  2+ palpable        POPLITEAL  2+ palpable   2+ palpable       POSTERIOR TIBIAL  2+ palpable   2+ palpable        DORSALIS PEDIS      ANTERIOR TIBIAL 1+ palpable  Not palpable      Gastrointestinal: soft, NTND, -G/R, - HSM, - masses palpated, - CVAT B.  Musculoskeletal: M/S 5/5 throughout, Extremities without ischemic changes.  Neurologic: CN 2-12 intact, Pain and light touch intact in extremities are intact, Motor exam as listed above     ASSESSMENT:  Jose Wright is a 76 y.o. male who has a known chronic dissection of abdominal aorta and stable small abdominal aortic aneurysm.  Prominent bilateral popliteal pulses, no popliteal aneurysm in either leg. He has no claudication symptoms with walking.  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   DATA  Carotid Duplex (07/22/17): Right ICA: no significant stenosis. Left ICA: 1-39% stenosis. Bilateral vertebral artery flow is antegrade.  Bilateral subclavian artery waveforms are normal.  No previous study for comparison.   AAA Duplex (07/22/17): Abdominal aorta maximal diameter:  2.9 cm (was 2.09 cm on 07-11-15); Right ICA: 1.0; Left ICA: 1.2 cm Known 1.4 cm long chronic dissection (was 1 cm on 07-11-15).  Slight increase in size of small AAA, stable dissection.    Popliteal artery Duplex (07-11-15): No evidence of popliteal artery aneurysm in the right or left lower extremity.   PLAN:  Based on today's exam and non-invasive vascular lab results, and after discussing with Dr. Myra GianottiBrabham, the  patient will follow up in 2 years with the following tests: CTA abd/pelvis; carotid duplex in 3 years.  I discussed in depth with the patient the nature of atherosclerosis, and emphasized the importance of maximal medical management including strict control of blood pressure, blood glucose, and lipid levels, obtaining regular exercise, and cessation of smoking.  The patient is aware that without maximal medical management the underlying atherosclerotic disease process will progress, limiting the benefit of any interventions.  Consideration for repair of AAA would be made when the size approaches 4.8 or 5.0 cm, growth > 1 cm/yr, and symptomatic status. The patient was given information about AAA including signs, symptoms, treatment,  what symptoms should prompt the patient to seek immediate medical care, and how to minimize the risk of enlargement and rupture of aneurysms.   Thank you for allowing us to participate in this patient's care.  Charisse MarchSuzanne Kyelle Urbas, RN, MSN, FNP-C [facility] Office: [phone]  Clinic MD: Myra GianottiBrabham 07/22/2017 9:05 AM

## 2017-07-22 NOTE — Patient Instructions (Signed)
Abdominal Aortic Aneurysm Blood pumps away from the heart through tubes (blood vessels) called arteries. Aneurysms are weak or damaged places in the wall of an artery. It bulges out like a balloon. An abdominal aortic aneurysm happens in the main artery of the body (aorta). It can burst or tear, causing bleeding inside the body. This is an emergency. It needs treatment right away. What are the causes? The exact cause is unknown. Things that could cause this problem include:  Fat and other substances building up in the lining of a tube.  Swelling of the walls of a blood vessel.  Certain tissue diseases.  Belly (abdominal) trauma.  An infection in the main artery of the body.  What increases the risk? There are things that make it more likely for you to have an aneurysm. These include:  Being over the age of 76 years old.  Having high blood pressure (hypertension).  Being a male.  Being white.  Being very overweight (obese).  Having a family history of aneurysm.  Using tobacco products.  What are the signs or symptoms? Symptoms depend on the size of the aneurysm and how fast it grows. There may not be symptoms. If symptoms occur, they can include:  Pain (belly, side, lower back, or groin).  Feeling full after eating a small amount of food.  Feeling sick to your stomach (nauseous), throwing up (vomiting), or both.  Feeling a lump in your belly that feels like it is beating (pulsating).  Feeling like you will pass out (faint).  How is this treated?  Medicine to control blood pressure and pain.  Imaging tests to see if the aneurysm gets bigger.  Surgery. How is this prevented? To lessen your chance of getting this condition:  Stop smoking. Stop chewing tobacco.  Limit or avoid alcohol.  Keep your blood pressure, blood sugar, and cholesterol within normal limits.  Eat less salt.  Eat foods low in saturated fats and cholesterol. These are found in animal and  whole dairy products.  Eat more fiber. Fiber is found in whole grains, vegetables, and fruits.  Keep a healthy weight.  Stay active and exercise often.  This information is not intended to replace advice given to you by your health care provider. Make sure you discuss any questions you have with your health care provider. Document Released: 12/15/2012 Document Revised: 01/26/2016 Document Reviewed: 09/19/2012 Elsevier Interactive Patient Education  2017 [employer] Inc.  

## 2019-04-07 ENCOUNTER — Emergency Department (HOSPITAL_BASED_OUTPATIENT_CLINIC_OR_DEPARTMENT_OTHER): Payer: No Typology Code available for payment source

## 2019-04-07 ENCOUNTER — Encounter (HOSPITAL_BASED_OUTPATIENT_CLINIC_OR_DEPARTMENT_OTHER): Payer: Self-pay | Admitting: Emergency Medicine

## 2019-04-07 ENCOUNTER — Emergency Department (HOSPITAL_BASED_OUTPATIENT_CLINIC_OR_DEPARTMENT_OTHER)
Admission: EM | Admit: 2019-04-07 | Discharge: 2019-04-07 | Disposition: A | Discharge status: Home / Self Care | Payer: No Typology Code available for payment source | Attending: Emergency Medicine | Admitting: Emergency Medicine

## 2019-04-07 ENCOUNTER — Other Ambulatory Visit: Payer: Self-pay

## 2019-04-07 DIAGNOSIS — R079 Chest pain, unspecified: Secondary | ICD-10-CM | POA: Diagnosis present

## 2019-04-07 DIAGNOSIS — I714 Abdominal aortic aneurysm, without rupture, unspecified: Secondary | ICD-10-CM

## 2019-04-07 DIAGNOSIS — Z79899 Other long term (current) drug therapy: Secondary | ICD-10-CM | POA: Insufficient documentation

## 2019-04-07 DIAGNOSIS — R072 Precordial pain: Secondary | ICD-10-CM | POA: Insufficient documentation

## 2019-04-07 DIAGNOSIS — R911 Solitary pulmonary nodule: Secondary | ICD-10-CM | POA: Diagnosis not present

## 2019-04-07 HISTORY — DX: Other allergy status, other than to drugs and biological substances: Z91.09

## 2019-04-07 HISTORY — DX: Gout, unspecified: M10.9

## 2019-04-07 LAB — CBC WITH DIFFERENTIAL/PLATELET
Abs Immature Granulocytes: 0.05 10*3/uL (ref 0.00–0.07)
Basophils Absolute: 0.1 10*3/uL (ref 0.0–0.1)
Basophils Relative: 1 %
Eosinophils Absolute: 0.7 10*3/uL — ABNORMAL HIGH (ref 0.0–0.5)
Eosinophils Relative: 11 %
HCT: 44.9 % (ref 39.0–52.0)
Hemoglobin: 14.8 g/dL (ref 13.0–17.0)
Immature Granulocytes: 1 %
Lymphocytes Relative: 32 %
Lymphs Abs: 2.1 10*3/uL (ref 0.7–4.0)
MCH: 32.5 pg (ref 26.0–34.0)
MCHC: 33 g/dL (ref 30.0–36.0)
MCV: 98.5 fL (ref 80.0–100.0)
Monocytes Absolute: 1 10*3/uL (ref 0.1–1.0)
Monocytes Relative: 16 %
Neutro Abs: 2.5 10*3/uL (ref 1.7–7.7)
Neutrophils Relative %: 39 %
Platelets: 175 10*3/uL (ref 150–400)
RBC: 4.56 MIL/uL (ref 4.22–5.81)
RDW: 13.5 % (ref 11.5–15.5)
WBC: 6.4 10*3/uL (ref 4.0–10.5)
nRBC: 0 % (ref 0.0–0.2)

## 2019-04-07 LAB — TROPONIN I (HIGH SENSITIVITY)
Troponin I (High Sensitivity): 4 ng/L (ref <18)
Troponin I (High Sensitivity): 5 ng/L (ref <18)

## 2019-04-07 LAB — COMPREHENSIVE METABOLIC PANEL
ALT: 20 U/L (ref 0–44)
AST: 22 U/L (ref 15–41)
Albumin: 3.9 g/dL (ref 3.5–5.0)
Alkaline Phosphatase: 48 U/L (ref 38–126)
Anion gap: 11 (ref 5–15)
BUN: 18 mg/dL (ref 8–23)
CO2: 20 mmol/L — ABNORMAL LOW (ref 22–32)
Calcium: 8.7 mg/dL — ABNORMAL LOW (ref 8.9–10.3)
Chloride: 108 mmol/L (ref 98–111)
Creatinine, Ser: 1.15 mg/dL (ref 0.61–1.24)
GFR calc Af Amer: >60 mL/min (ref >60)
GFR calc non Af Amer: >60 mL/min (ref >60)
Glucose, Bld: 91 mg/dL (ref 70–99)
Potassium: 4.2 mmol/L (ref 3.5–5.1)
Sodium: 139 mmol/L (ref 135–145)
Total Bilirubin: 0.5 mg/dL (ref 0.3–1.2)
Total Protein: 7 g/dL (ref 6.5–8.1)

## 2019-04-07 MED ORDER — IOHEXOL 350 MG/ML SOLN
100.0000 mL | Freq: Once | INTRAVENOUS | Status: AC | PRN
  Administered 2019-04-07: 100 mL via INTRAVENOUS

## 2019-04-07 NOTE — Discharge Instructions (Signed)
Your CT scan today showed your aortic aneurysm has not changed recently.  It is 3.1cm in size.  You have a pulmonary nodule (lung nodule).  Please follow up with your family doctor for recheck.

## 2019-04-07 NOTE — ED Notes (Signed)
Pt on monitor 

## 2019-04-07 NOTE — ED Triage Notes (Signed)
Pt states he has facial droop on right for 6 months, chest pain 2 days ago, and left arm numbness/pain since yesterday.  Denies chest pain currently.  No SOB.  No weakness.  No numbness currently.  Pt states his wife sent him here to be checked out.

## 2019-04-07 NOTE — ED Provider Notes (Signed)
[facility] EMERGENCY DEPARTMENT Provider Note   CSN: [id number] Arrival date & time: 04/07/19  1536    History   Chief Complaint Chief Complaint  Patient presents with   Numbness   Chest Pain    2 days ago    HPI Vivianne SpenceBrinkley Bearman is a 78 y.o. male.     The history is provided by the patient, medical records and the spouse. No language interpreter was used.  Chest Pain  Vivianne SpenceBrinkley Miyoshi is a 78 y.o. male who presents to the Emergency Department complaining of chest pain and numbness. Level V caveat as patient is a very vague and inconsistent historian. He presents to the emergency department with complaints of chest pain and arm numbness. His chest pain began between two days in a week ago. It was located in the central chest and described as mild or moderate in nature. It was intermittent for 24 hours and since then has been completely resolved. He also had an episode of our numbness. The numbness is located in the left upper arm and the episode lasted for a couple of hours and then resolved. It is unclear if this occurred before after the chest pain but he states it was not at the same time as a chest pain. When he mentioned his symptoms to his neighbor, the neighbor prompted him to contact his doctor, who recommended evaluation the emergency department. He also complains of droop to the right side of his face for about six months. He lives at home with his wife as well as two other friends. No known coronavirus exposures. [facility]. He is currently asymptomatic. Past Medical History:  Diagnosis Date   AAA (abdominal aortic aneurysm) (HCC)    Environmental allergies    Gout    Hyperlipemia     Patient Active Problem List   Diagnosis Date Noted   Gait disturbance secondary to gout flare 07/10/2012   Gout attack 07/10/2012   Hypokalemia 07/09/2012   Normocytic anemia 07/09/2012   Influenza-like illness 07/06/2012    Dehydration 07/06/2012   Pain in both feet secondary to gout flare 07/06/2012   Hyperlipidemia 07/06/2012    Past Surgical History:  Procedure Laterality Date   TONSILLECTOMY     age 61 years        Home Medications    Prior to Admission medications   Medication Sig Start Date End Date Taking? Authorizing Provider  cetirizine (ZYRTEC) 10 MG tablet Take 10 mg by mouth daily.   Yes [provider]  Cholecalciferol (VITAMIN D) 50 MCG (2000 UT) CAPS Take 1 capsule by mouth daily.   Yes [provider]  cholecalciferol (VITAMIN D3) 10 MCG (400 UNIT) TABS tablet Take 400 Units by mouth.   Yes [provider]  cyclobenzaprine (FLEXERIL) 10 MG tablet Take 10 mg by mouth 3 (three) times daily as needed for muscle spasms.   Yes [provider]  montelukast (SINGULAIR) 10 MG tablet Take 10 mg by mouth at bedtime.   Yes [provider]  simvastatin (ZOCOR) 40 MG tablet Take 40 mg by mouth daily.   Yes [provider]  acetaminophen (TYLENOL) 500 MG tablet Take 1,000 mg by mouth every 6 (six) hours as needed. Pain    [provider]  allopurinol (ZYLOPRIM) 100 MG tablet Take 200 mg by mouth daily.     [provider]  atorvastatin (LIPITOR) 40 MG tablet Take 20 mg by mouth daily.  [provider]  colchicine 0.6 MG tablet Take 1 tablet (0.6 mg total) by mouth 2 (two) times daily. 07/10/12   Rama, Maryruth Bunhristina P, MD  diphenhydrAMINE (BENADRYL) 25 MG tablet Take 25 mg by mouth every 6 (six) hours as needed.    [provider]  divalproex (DEPAKOTE ER) 250 MG 24 hr tablet Take 750 mg by mouth daily.     [provider]  pseudoephedrine-acetaminophen (TYLENOL SINUS) 30-500 MG TABS Take 1 tablet by mouth every 4 (four) hours as needed. congestion    [provider]    Family History Family History  Problem Relation Age of Onset   Hyperlipidemia Father    Asthma Son     Social  History Social History   Tobacco Use   Smoking status: Never Smoker   Smokeless tobacco: Never Used  Substance Use Topics   Alcohol use: No   Drug use: No     Allergies   Patient has no known allergies.   Review of Systems Review of Systems  Cardiovascular: Positive for chest pain.  All other systems reviewed and are negative.    Physical Exam Updated Vital Signs BP (!) 150/89 (BP Location: Left Arm)    Pulse 71    Temp 98.5 F (36.9 C) (Oral)    Resp 16    Ht 5\' 5"  (1.651 m)    Wt 70.3 kg    SpO2 99%    BMI 25.79 kg/m   Physical Exam Vitals signs and nursing note reviewed.  Constitutional:      Appearance: He is well-developed.  HENT:     Head: Normocephalic and atraumatic.  Cardiovascular:     Rate and Rhythm: Normal rate and regular rhythm.     Heart sounds: No murmur.  Pulmonary:     Effort: Pulmonary effort is normal. No respiratory distress.     Breath sounds: Normal breath sounds.  Abdominal:     Palpations: Abdomen is soft.     Tenderness: There is no abdominal tenderness. There is no guarding or rebound.  Musculoskeletal:        General: No swelling or tenderness.     Comments: 2+ DP and radial pulses bilaterally.  Skin:    General: Skin is warm and dry.  Neurological:     Mental Status: He is alert and oriented to person, place, and time.     Comments: Visual fields are grossly intact. EOM I. There is mild right lower facial droop at rest that resolved with strength testing. No pronated or drift. Five out of five strength in all four extremities with sensation to light touch intact in all four extremities  Psychiatric:        Behavior: Behavior normal.      ED Treatments / Results  Labs (all labs ordered are listed, but only abnormal results are displayed) Labs Reviewed  COMPREHENSIVE METABOLIC PANEL - Abnormal; Notable for the following components:      Result Value   CO2 20 (*)    Calcium 8.7 (*)    All other components within normal  limits  CBC WITH DIFFERENTIAL/PLATELET - Abnormal; Notable for the following components:   Eosinophils Absolute 0.7 (*)    All other components within normal limits  TROPONIN I (HIGH SENSITIVITY)  TROPONIN I (HIGH SENSITIVITY)    EKG EKG Interpretation  Date/Time:  Tuesday April 07 2019 15:50:40 EDT Ventricular Rate:  69 PR Interval:    QRS Duration: 120 QT Interval:  411 QTC Calculation:  441 R Axis:   -71 Text Interpretation:  Sinus rhythm 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 COMPARISON:  None. FINDINGS: Brain: No evidence of acute infarction, hemorrhage, hydrocephalus, extra-axial collection or mass lesion/mass effect. Scattered low-density changes within the periventricular and subcortical white matter compatible with chronic microvascular ischemic disease. There is mild diffuse cerebral volume loss. Vascular: Scattered atherosclerotic calcifications of the large vessels of the skull base. No unexpected hyperdense vessel. Skull: Normal. Negative for fracture or focal lesion. Sinuses/Orbits: No acute finding. Other: None. IMPRESSION: 1. No acute intracranial findings. 2. Chronic microvascular ischemic changes and cerebral volume loss. Electronically Signed   By: Davina Poke M.D.   On: 04/07/2019 16:53   Dg Chest Port 1 View  Result Date: 04/07/2019 CLINICAL DATA:  Two day history of chest pain and left arm numbness. EXAM: PORTABLE CHEST 1 VIEW COMPARISON:  07/06/2012 FINDINGS: The cardiac silhouette, mediastinal and hilar contours are within normal limits and stable. The lungs are clear. No pleural effusions. The bony thorax is  intact. IMPRESSION: No acute cardiopulmonary findings. Electronically Signed   By: Marijo Sanes M.D.   On: 04/07/2019 16:52   Ct Angio Chest/abd/pel For Dissection W And/or W/wo  Result Date: 04/07/2019 CLINICAL DATA:  Arm numbness. EXAM: CT ANGIOGRAPHY CHEST, ABDOMEN AND PELVIS TECHNIQUE: Multidetector CT imaging through the chest, abdomen and pelvis was performed using the standard protocol during bolus administration of intravenous contrast. Multiplanar reconstructed images and MIPs were obtained and reviewed to evaluate the vascular anatomy. CONTRAST:  149mL OMNIPAQUE IOHEXOL 350 MG/ML SOLN COMPARISON:  None. FINDINGS: CTA CHEST FINDINGS Cardiovascular: Contrast injection is sufficient to demonstrate satisfactory opacification of the pulmonary arteries to the segmental level. There is no pulmonary embolus. The main pulmonary artery is within normal limits for size. There is no CT evidence of acute right heart strain. There are atherosclerotic changes of the thoracic aorta. The left subclavian artery and visualized left axillary artery are both patent. There is no significant stenosis at the origin of the left subclavian artery. Coronary artery calcifications are noted. Mediastinum/Nodes: --No mediastinal or hilar lymphadenopathy. --No axillary lymphadenopathy. --No supraclavicular lymphadenopathy. --Normal thyroid gland. --The esophagus is unremarkable Lungs/Pleura: There is a 6 mm perifissural nodule on the left (axial series 8, image 34). The lungs are otherwise clear with no evidence of a pneumothorax or large pleural effusion. The trachea is unremarkable. Musculoskeletal: No chest wall abnormality. No acute or significant osseous findings. Review of the MIP images confirms the above findings. CTA ABDOMEN AND PELVIS FINDINGS VASCULAR Aorta: Atherosclerotic changes are noted. There is a focal aneurysmal section of the infrarenal abdominal aortic aneurysm measuring approximately 3.1 cm (axial series 6,  image 123 and sagittal series 11, image 101). Celiac: Patent without evidence of aneurysm, dissection, vasculitis or significant stenosis. SMA: Patent without evidence of aneurysm, dissection, vasculitis or significant stenosis. Renals: Both renal arteries are patent without evidence of aneurysm, dissection, vasculitis, fibromuscular dysplasia or significant stenosis. IMA: Patent without evidence of aneurysm, dissection, vasculitis or significant stenosis. Inflow: Patent without evidence of aneurysm, dissection, vasculitis or significant stenosis. Veins: No obvious venous abnormality within the limitations of this arterial phase study. Review of the MIP images  confirms the above findings. NON-VASCULAR Hepatobiliary: There is decreased hepatic attenuation suggestive of hepatic steatosis. Normal gallbladder.There is no biliary ductal dilation. Pancreas: Normal contours without ductal dilatation. No peripancreatic fluid collection. Spleen: No splenic laceration or hematoma. Adrenals/Urinary Tract: --Adrenal glands: No adrenal hemorrhage. --Right kidney/ureter: No hydronephrosis or perinephric hematoma. --Left kidney/ureter: No hydronephrosis or perinephric hematoma. --Urinary bladder: Unremarkable. Stomach/Bowel: --Stomach/Duodenum: No hiatal hernia or other gastric abnormality. Normal duodenal course and caliber. --Small bowel: No dilatation or inflammation. --Colon: No focal abnormality. --Appendix: Normal. Vascular/Lymphatic: See above for evaluation of the abdominal aorta. --No retroperitoneal lymphadenopathy. --No mesenteric lymphadenopathy. --No pelvic or inguinal lymphadenopathy. Reproductive: The prostate gland is enlarged. Other: No ascites or free air. The abdominal wall is normal. Musculoskeletal. No acute displaced fractures. Review of the MIP images confirms the above findings. IMPRESSION: 1. No acute vascular abnormality detected. 2. There is a 3.1 cm infrarenal abdominal aortic aneurysm. Recommend  followup by ultrasound in 3 years. This recommendation follows ACR consensus guidelines: White Paper of the ACR Incidental Findings Committee II on Vascular Findings. J Am Coll Radiol 2013; 10:789-794. Aortic aneurysm NOS (ICD10-I71.9) 3. Hepatic steatosis 4. There is a 6 mm perifissural nodule on the left, likely benign given its location. Non-contrast chest CT at 6-12 months is recommended. If the nodule is stable at time of repeat CT, then future CT at 18-24 months (from today's scan) is considered optional for low-risk patients, but is recommended for high-risk patients. This recommendation follows the consensus statement: Guidelines for Management of Incidental Pulmonary Nodules Detected on CT Images: From the Fleischner Society 2017; Radiology 2017; 284:228-243. Electronically Signed   By: Katherine Mantlehristopher  Green M.D.   On: 04/07/2019 19:16    Procedures Procedures (including critical care time)  Medications Ordered in ED Medications  iohexol (OMNIPAQUE) 350 MG/ML injection 100 mL (100 mLs Intravenous Contrast Given 04/07/19 1819)     Initial Impression / Assessment and Plan / ED Course  I have reviewed the triage vital signs and the nursing notes.  Pertinent labs & imaging results that were available during my care of the patient were reviewed by me and considered in my medical decision making (see chart for details).        Pt here for evaluation of chest pain, transient left arm numbness that occurred several days ago - now resolved.  EKG without acute ischemic changes and troponin negative times 2.  Given hx/o AAA CTA obtained, which is negative for change in aneurysm or dissection.  Discussed with patient incidental finding of pulmonary nodule.  Presentation is not c/w CVA/TIA. D/w pt home care for AAA, chest pain.  Discussed outpatient follow up and return precautions.  Offered to discuss findings with patient's wife but she is not available for telephone discussion.    Final Clinical  Impressions(s) / ED Diagnoses   Final diagnoses:  Precordial pain  Pulmonary nodule  Abdominal aortic aneurysm (AAA) without rupture [facility](HCC)    ED Discharge Orders    None       Tilden Fossaees, Evvie Behrmann, MD 04/07/19 2038

## 2019-09-10 DIAGNOSIS — Z23 Encounter for immunization: Secondary | ICD-10-CM | POA: Diagnosis not present

## 2020-05-04 IMAGING — DX PORTABLE CHEST - 1 VIEW
1 series · 1 of 1 positions shown · non-contrast
Comparison: 07/06/2012

CLINICAL DATA: Two day history of chest pain and left arm numbness.

EXAM:
PORTABLE CHEST 1 VIEW

[chest ap]
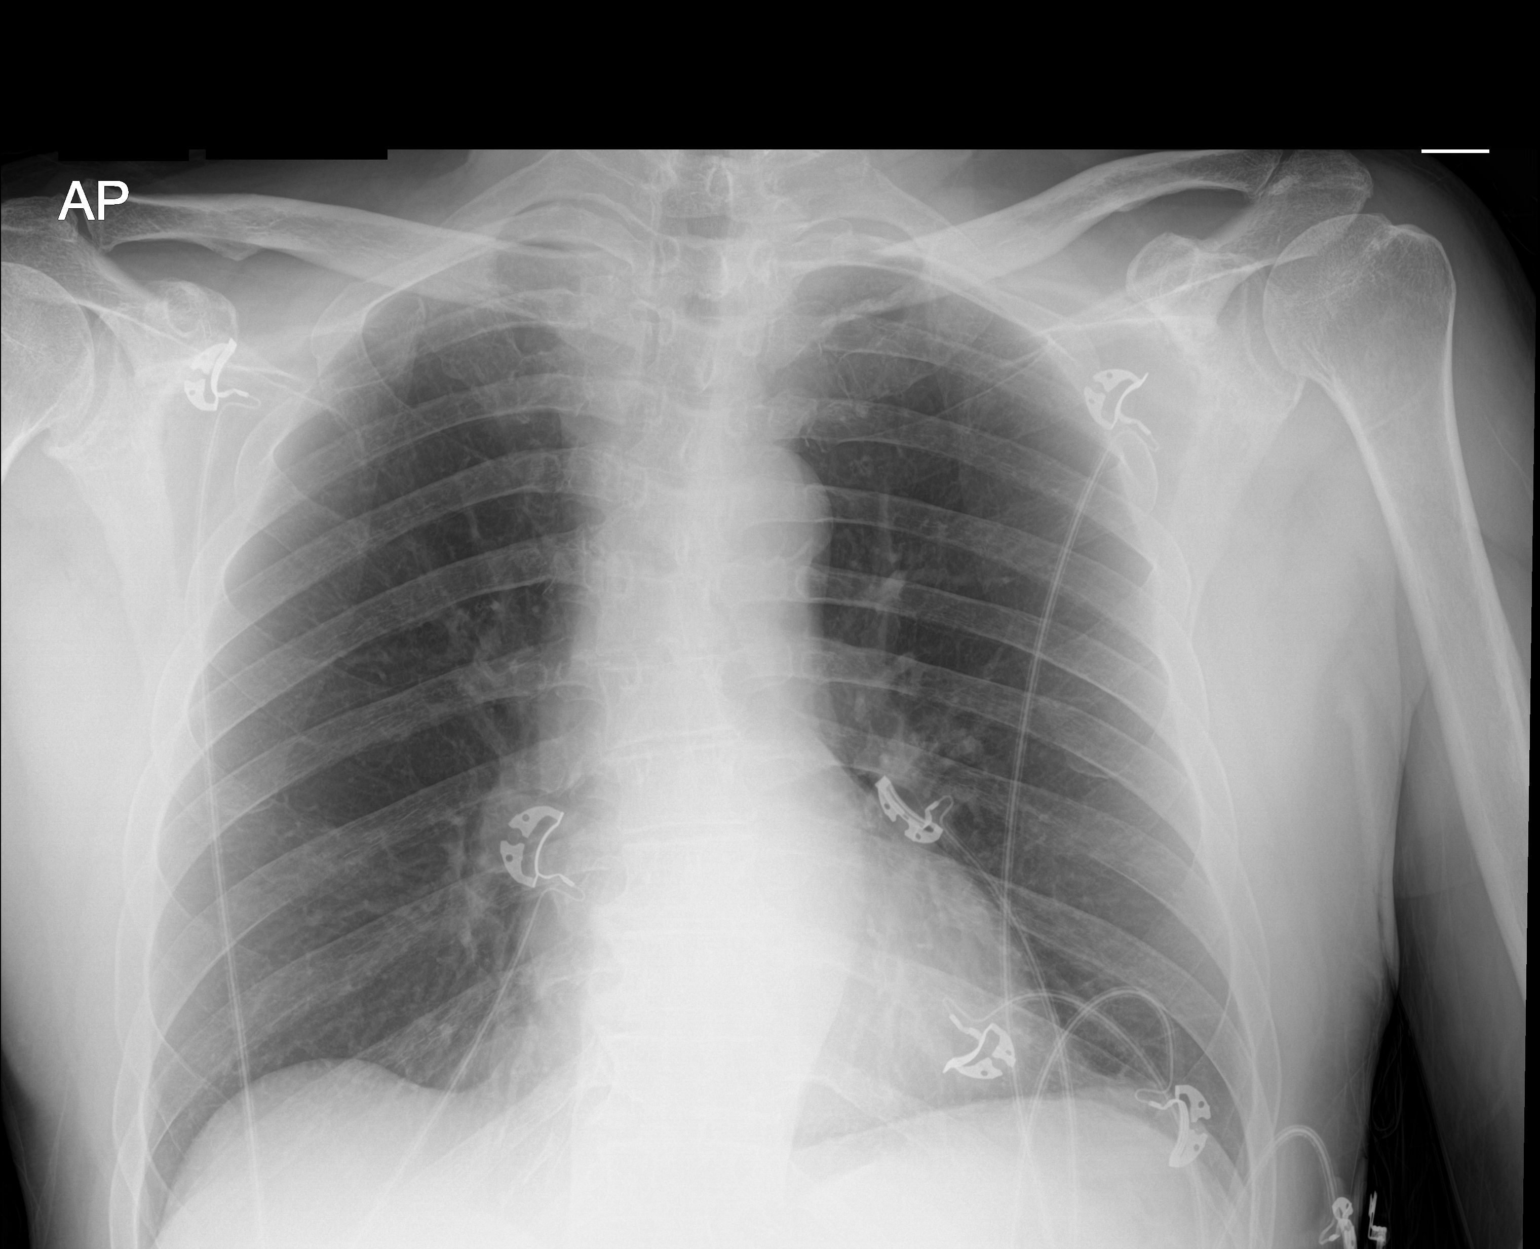

[1 of 1 positions shown; findings below may reference images not displayed]

FINDINGS: The cardiac silhouette, mediastinal and hilar contours are within
normal limits and stable. The lungs are clear. No pleural effusions.
The bony thorax is intact.
IMPRESSION: No acute cardiopulmonary findings.
